# Patient Record
Sex: Female | Born: 1960
Health system: Southern US, Community
[De-identification: ages and names within clinical notes are randomized; demographics above are authoritative.]

## PROBLEM LIST (undated history)

## (undated) DIAGNOSIS — C649 Malignant neoplasm of unspecified kidney, except renal pelvis: Secondary | ICD-10-CM

## (undated) DIAGNOSIS — Z860100 Personal history of colon polyps, unspecified: Secondary | ICD-10-CM

## (undated) DIAGNOSIS — Z923 Personal history of irradiation: Secondary | ICD-10-CM

## (undated) DIAGNOSIS — C50919 Malignant neoplasm of unspecified site of unspecified female breast: Secondary | ICD-10-CM

## (undated) HISTORY — PX: NEPHRECTOMY: SHX65

---

## 2005-05-24 ENCOUNTER — Ambulatory Visit: Payer: Self-pay | Admitting: Family Medicine

## 2005-06-16 ENCOUNTER — Ambulatory Visit: Payer: Self-pay

## 2005-08-13 ENCOUNTER — Ambulatory Visit: Payer: Self-pay | Admitting: Gastroenterology

## 2005-08-13 HISTORY — PX: COLONOSCOPY W/ POLYPECTOMY: SHX1380

## 2005-09-17 ENCOUNTER — Ambulatory Visit: Payer: Self-pay | Admitting: Surgery

## 2006-07-07 ENCOUNTER — Ambulatory Visit: Payer: Self-pay

## 2006-12-02 ENCOUNTER — Ambulatory Visit: Payer: Self-pay | Admitting: Family Medicine

## 2007-07-19 ENCOUNTER — Ambulatory Visit: Payer: Self-pay

## 2007-12-05 ENCOUNTER — Ambulatory Visit: Payer: Self-pay | Admitting: Family Medicine

## 2008-02-02 DIAGNOSIS — C50919 Malignant neoplasm of unspecified site of unspecified female breast: Secondary | ICD-10-CM

## 2008-02-02 DIAGNOSIS — Z923 Personal history of irradiation: Secondary | ICD-10-CM

## 2008-02-02 HISTORY — PX: BREAST LUMPECTOMY: SHX2

## 2008-02-02 HISTORY — DX: Personal history of irradiation: Z92.3

## 2008-02-02 HISTORY — PX: BREAST BIOPSY: SHX20

## 2008-02-02 HISTORY — DX: Malignant neoplasm of unspecified site of unspecified female breast: C50.919

## 2008-04-03 ENCOUNTER — Ambulatory Visit: Payer: Self-pay | Admitting: Family Medicine

## 2008-09-03 ENCOUNTER — Ambulatory Visit: Payer: Self-pay

## 2008-09-09 ENCOUNTER — Ambulatory Visit: Payer: Self-pay

## 2008-09-20 ENCOUNTER — Ambulatory Visit: Payer: Self-pay | Admitting: Surgery

## 2008-09-27 ENCOUNTER — Ambulatory Visit: Payer: Self-pay | Admitting: Surgery

## 2008-10-02 ENCOUNTER — Ambulatory Visit: Payer: Self-pay | Admitting: Oncology

## 2008-10-14 ENCOUNTER — Ambulatory Visit: Payer: Self-pay | Admitting: Oncology

## 2008-10-25 ENCOUNTER — Encounter: Admission: RE | Admit: 2008-10-25 | Discharge: 2008-10-25 | Payer: Self-pay | Admitting: Oncology

## 2008-11-01 ENCOUNTER — Ambulatory Visit: Payer: Self-pay | Admitting: Oncology

## 2008-11-06 ENCOUNTER — Ambulatory Visit: Payer: Self-pay | Admitting: Surgery

## 2008-11-08 ENCOUNTER — Ambulatory Visit: Payer: Self-pay | Admitting: Surgery

## 2008-12-02 ENCOUNTER — Ambulatory Visit: Payer: Self-pay | Admitting: Oncology

## 2009-01-01 ENCOUNTER — Ambulatory Visit: Payer: Self-pay | Admitting: Oncology

## 2009-02-01 ENCOUNTER — Ambulatory Visit: Payer: Self-pay | Admitting: Oncology

## 2009-03-04 ENCOUNTER — Ambulatory Visit: Payer: Self-pay | Admitting: Oncology

## 2009-04-01 ENCOUNTER — Ambulatory Visit: Payer: Self-pay | Admitting: Oncology

## 2009-05-02 ENCOUNTER — Ambulatory Visit: Payer: Self-pay | Admitting: Oncology

## 2009-05-28 ENCOUNTER — Ambulatory Visit: Payer: Self-pay | Admitting: Oncology

## 2009-06-01 ENCOUNTER — Ambulatory Visit: Payer: Self-pay | Admitting: Oncology

## 2009-07-02 ENCOUNTER — Ambulatory Visit: Payer: Self-pay | Admitting: Oncology

## 2009-09-25 ENCOUNTER — Ambulatory Visit: Payer: Self-pay | Admitting: Oncology

## 2009-10-02 ENCOUNTER — Ambulatory Visit: Payer: Self-pay | Admitting: Oncology

## 2009-11-01 ENCOUNTER — Ambulatory Visit: Payer: Self-pay | Admitting: Oncology

## 2009-12-29 ENCOUNTER — Ambulatory Visit: Payer: Self-pay | Admitting: Oncology

## 2009-12-31 LAB — CANCER ANTIGEN 27.29: CA 27.29: 23.4 U/mL (ref 0.0–38.6)

## 2010-01-01 ENCOUNTER — Ambulatory Visit: Payer: Self-pay | Admitting: Oncology

## 2010-03-31 ENCOUNTER — Ambulatory Visit: Payer: Self-pay | Admitting: Oncology

## 2010-04-02 ENCOUNTER — Ambulatory Visit: Payer: Self-pay | Admitting: Oncology

## 2010-06-10 ENCOUNTER — Ambulatory Visit: Payer: Self-pay | Admitting: Oncology

## 2010-06-19 ENCOUNTER — Other Ambulatory Visit: Payer: Self-pay | Admitting: Internal Medicine

## 2010-07-03 ENCOUNTER — Ambulatory Visit: Payer: Self-pay | Admitting: Oncology

## 2010-10-03 ENCOUNTER — Ambulatory Visit: Payer: Self-pay | Admitting: Oncology

## 2010-10-15 ENCOUNTER — Ambulatory Visit: Payer: Self-pay | Admitting: Oncology

## 2010-11-02 ENCOUNTER — Ambulatory Visit: Payer: Self-pay | Admitting: Oncology

## 2010-12-17 ENCOUNTER — Other Ambulatory Visit: Payer: Self-pay | Admitting: Family Medicine

## 2010-12-18 ENCOUNTER — Ambulatory Visit: Payer: Self-pay | Admitting: Gastroenterology

## 2011-04-15 ENCOUNTER — Ambulatory Visit: Payer: Self-pay | Admitting: Oncology

## 2011-04-15 LAB — CBC CANCER CENTER
Eosinophil #: 0 x10 3/mm (ref 0.0–0.7)
Eosinophil %: 0.9 %
HCT: 37.4 % (ref 35.0–47.0)
HGB: 12.6 g/dL (ref 12.0–16.0)
MCH: 31.3 pg (ref 26.0–34.0)
MCV: 93 fL (ref 80–100)
Monocyte #: 0.4 x10 3/mm (ref 0.0–0.7)
Monocyte %: 10.1 %
Platelet: 172 x10 3/mm (ref 150–440)
RBC: 4.03 10*6/uL (ref 3.80–5.20)
WBC: 3.8 x10 3/mm (ref 3.6–11.0)

## 2011-04-15 LAB — COMPREHENSIVE METABOLIC PANEL
Albumin: 3.4 g/dL (ref 3.4–5.0)
Alkaline Phosphatase: 60 U/L (ref 50–136)
BUN: 16 mg/dL (ref 7–18)
Calcium, Total: 8.4 mg/dL — ABNORMAL LOW (ref 8.5–10.1)
SGOT(AST): 30 U/L (ref 15–37)

## 2011-05-03 ENCOUNTER — Ambulatory Visit: Payer: Self-pay | Admitting: Oncology

## 2011-06-10 ENCOUNTER — Ambulatory Visit: Payer: Self-pay | Admitting: Oncology

## 2011-07-03 ENCOUNTER — Ambulatory Visit: Payer: Self-pay | Admitting: Oncology

## 2011-10-07 ENCOUNTER — Ambulatory Visit: Payer: Self-pay | Admitting: Oncology

## 2011-10-14 ENCOUNTER — Ambulatory Visit: Payer: Self-pay | Admitting: Oncology

## 2011-10-14 LAB — CBC CANCER CENTER
Basophil %: 0.7 %
Eosinophil #: 0.1 x10 3/mm (ref 0.0–0.7)
Eosinophil %: 1.6 %
Lymphocyte #: 1.3 x10 3/mm (ref 1.0–3.6)
MCH: 32.4 pg (ref 26.0–34.0)
MCHC: 33.4 g/dL (ref 32.0–36.0)
MCV: 97 fL (ref 80–100)
Monocyte #: 0.3 x10 3/mm (ref 0.2–0.9)
Platelet: 182 x10 3/mm (ref 150–440)
RBC: 4.19 10*6/uL (ref 3.80–5.20)
WBC: 4.1 x10 3/mm (ref 3.6–11.0)

## 2011-10-14 LAB — COMPREHENSIVE METABOLIC PANEL
Bilirubin,Total: 0.5 mg/dL (ref 0.2–1.0)
Chloride: 104 mmol/L (ref 98–107)
Co2: 30 mmol/L (ref 21–32)
Creatinine: 0.94 mg/dL (ref 0.60–1.30)
Osmolality: 278 (ref 275–301)
Sodium: 139 mmol/L (ref 136–145)

## 2011-11-02 ENCOUNTER — Ambulatory Visit: Payer: Self-pay | Admitting: Oncology

## 2012-02-08 ENCOUNTER — Other Ambulatory Visit: Payer: Self-pay | Admitting: Family Medicine

## 2012-02-08 LAB — COMPREHENSIVE METABOLIC PANEL
Alkaline Phosphatase: 76 U/L (ref 50–136)
Anion Gap: 7 (ref 7–16)
BUN: 13 mg/dL (ref 7–18)
EGFR (African American): >60
Osmolality: 278 (ref 275–301)
SGOT(AST): 28 U/L (ref 15–37)

## 2012-02-08 LAB — LIPID PANEL
HDL Cholesterol: 58 mg/dL (ref 40–60)
Ldl Cholesterol, Calc: 138 mg/dL — ABNORMAL HIGH (ref 0–100)
Triglycerides: 93 mg/dL (ref 0–200)

## 2012-02-08 LAB — HEMOGLOBIN A1C: Hemoglobin A1C: 5.2 % (ref 4.2–6.3)

## 2012-04-20 ENCOUNTER — Ambulatory Visit: Payer: Self-pay | Admitting: Oncology

## 2012-04-20 LAB — CBC CANCER CENTER
Basophil #: 0 x10 3/mm (ref 0.0–0.1)
Basophil %: 0.7 %
Eosinophil %: 1.7 %
Monocyte %: 10.3 %
Neutrophil #: 1.9 x10 3/mm (ref 1.4–6.5)
Platelet: 178 x10 3/mm (ref 150–440)
RBC: 4.35 10*6/uL (ref 3.80–5.20)
RDW: 12.9 % (ref 11.5–14.5)
WBC: 4.1 x10 3/mm (ref 3.6–11.0)

## 2012-04-20 LAB — COMPREHENSIVE METABOLIC PANEL
Albumin: 3.5 g/dL (ref 3.4–5.0)
Anion Gap: 4 — ABNORMAL LOW (ref 7–16)
Bilirubin,Total: 0.5 mg/dL (ref 0.2–1.0)
Calcium, Total: 8.5 mg/dL (ref 8.5–10.1)
Creatinine: 1.06 mg/dL (ref 0.60–1.30)
EGFR (African American): >60
EGFR (Non-African Amer.): >60
Osmolality: 268 (ref 275–301)
SGOT(AST): 32 U/L (ref 15–37)
SGPT (ALT): 43 U/L (ref 12–78)
Total Protein: 7.9 g/dL (ref 6.4–8.2)

## 2012-04-21 LAB — CANCER ANTIGEN 27.29: CA 27.29: 20.2 U/mL (ref 0.0–38.6)

## 2012-05-02 ENCOUNTER — Ambulatory Visit: Payer: Self-pay | Admitting: Oncology

## 2012-06-14 ENCOUNTER — Ambulatory Visit: Payer: Self-pay | Admitting: Oncology

## 2012-10-09 ENCOUNTER — Ambulatory Visit: Payer: Self-pay | Admitting: Oncology

## 2012-10-23 ENCOUNTER — Ambulatory Visit: Payer: Self-pay | Admitting: Oncology

## 2012-10-23 LAB — COMPREHENSIVE METABOLIC PANEL
Alkaline Phosphatase: 76 U/L (ref 50–136)
Anion Gap: 9 (ref 7–16)
Bilirubin,Total: 0.4 mg/dL (ref 0.2–1.0)
Calcium, Total: 9 mg/dL (ref 8.5–10.1)
Chloride: 103 mmol/L (ref 98–107)
Creatinine: 0.92 mg/dL (ref 0.60–1.30)
EGFR (African American): >60
EGFR (Non-African Amer.): >60
Glucose: 97 mg/dL (ref 65–99)
SGOT(AST): 27 U/L (ref 15–37)
SGPT (ALT): 24 U/L (ref 12–78)
Sodium: 141 mmol/L (ref 136–145)
Total Protein: 7.6 g/dL (ref 6.4–8.2)

## 2012-10-23 LAB — CBC CANCER CENTER
Basophil #: 0 x10 3/mm (ref 0.0–0.1)
Eosinophil #: 0.1 x10 3/mm (ref 0.0–0.7)
Eosinophil %: 1.9 %
HCT: 39.5 % (ref 35.0–47.0)
HGB: 13.4 g/dL (ref 12.0–16.0)
Lymphocyte #: 1.5 x10 3/mm (ref 1.0–3.6)
Monocyte %: 11.2 %
Neutrophil #: 1.6 x10 3/mm (ref 1.4–6.5)
Neutrophil %: 44.7 %
Platelet: 192 x10 3/mm (ref 150–440)
WBC: 3.6 x10 3/mm (ref 3.6–11.0)

## 2012-11-01 ENCOUNTER — Ambulatory Visit: Payer: Self-pay | Admitting: Oncology

## 2013-01-28 ENCOUNTER — Observation Stay: Payer: Self-pay | Admitting: Surgery

## 2013-01-28 LAB — URINALYSIS, COMPLETE
Ketone: NEGATIVE
Nitrite: NEGATIVE
Ph: 6 (ref 4.5–8.0)
Specific Gravity: 1.008 (ref 1.003–1.030)

## 2013-01-28 LAB — CBC WITH DIFFERENTIAL/PLATELET
Basophil #: 0.1 10*3/uL (ref 0.0–0.1)
Basophil %: 0.5 %
HCT: 39 % (ref 35.0–47.0)
HGB: 13.2 g/dL (ref 12.0–16.0)
Lymphocyte #: 1.1 10*3/uL (ref 1.0–3.6)
MCH: 31.4 pg (ref 26.0–34.0)
MCHC: 33.9 g/dL (ref 32.0–36.0)
Monocyte #: 1.1 x10 3/mm — ABNORMAL HIGH (ref 0.2–0.9)
Monocyte %: 7.9 %
Neutrophil #: 11.1 10*3/uL — ABNORMAL HIGH (ref 1.4–6.5)
RDW: 13.2 % (ref 11.5–14.5)

## 2013-01-28 LAB — COMPREHENSIVE METABOLIC PANEL
Anion Gap: 6 — ABNORMAL LOW (ref 7–16)
Bilirubin,Total: 0.8 mg/dL (ref 0.2–1.0)
Chloride: 106 mmol/L (ref 98–107)
Osmolality: 276 (ref 275–301)
SGOT(AST): 57 U/L — ABNORMAL HIGH (ref 15–37)
SGPT (ALT): 49 U/L (ref 12–78)
Sodium: 138 mmol/L (ref 136–145)

## 2013-01-28 LAB — HCG, QUANTITATIVE, PREGNANCY: Beta Hcg, Quant.: <1 m[IU]/mL — ABNORMAL LOW

## 2013-01-30 LAB — PATHOLOGY REPORT

## 2013-02-01 DIAGNOSIS — C649 Malignant neoplasm of unspecified kidney, except renal pelvis: Secondary | ICD-10-CM

## 2013-02-01 HISTORY — PX: BREAST BIOPSY: SHX20

## 2013-02-01 HISTORY — DX: Malignant neoplasm of unspecified kidney, except renal pelvis: C64.9

## 2013-02-15 ENCOUNTER — Ambulatory Visit: Payer: Self-pay | Admitting: Surgery

## 2013-04-01 HISTORY — PX: NEPHRECTOMY: SHX65

## 2013-04-01 HISTORY — PX: TOTAL ABDOMINAL HYSTERECTOMY: SHX209

## 2013-04-10 ENCOUNTER — Ambulatory Visit: Payer: Self-pay | Admitting: Urology

## 2013-04-10 LAB — BASIC METABOLIC PANEL
ANION GAP: 2 — AB (ref 7–16)
BUN: 14 mg/dL (ref 7–18)
CHLORIDE: 106 mmol/L (ref 98–107)
CO2: 32 mmol/L (ref 21–32)
CREATININE: 0.73 mg/dL (ref 0.60–1.30)
Calcium, Total: 8.8 mg/dL (ref 8.5–10.1)
EGFR (African American): >60
GLUCOSE: 93 mg/dL (ref 65–99)
OSMOLALITY: 280 (ref 275–301)
POTASSIUM: 3.9 mmol/L (ref 3.5–5.1)
Sodium: 140 mmol/L (ref 136–145)

## 2013-04-10 LAB — HEMOGLOBIN: HGB: 12.2 g/dL (ref 12.0–16.0)

## 2013-04-10 LAB — PREGNANCY, URINE: PREGNANCY TEST, URINE: NEGATIVE m[IU]/mL

## 2013-04-16 ENCOUNTER — Inpatient Hospital Stay: Payer: Self-pay | Admitting: Urology

## 2013-04-17 LAB — BASIC METABOLIC PANEL
Anion Gap: 8 (ref 7–16)
BUN: 14 mg/dL (ref 7–18)
CALCIUM: 8.3 mg/dL — AB (ref 8.5–10.1)
CHLORIDE: 106 mmol/L (ref 98–107)
CO2: 22 mmol/L (ref 21–32)
Creatinine: 1.21 mg/dL (ref 0.60–1.30)
EGFR (African American): >60
EGFR (Non-African Amer.): 52 — ABNORMAL LOW
GLUCOSE: 89 mg/dL (ref 65–99)
Osmolality: 272 (ref 275–301)
POTASSIUM: 4.4 mmol/L (ref 3.5–5.1)
Sodium: 136 mmol/L (ref 136–145)

## 2013-04-18 LAB — CBC WITH DIFFERENTIAL/PLATELET
BASOS PCT: 0.8 %
Basophil #: 0.1 10*3/uL (ref 0.0–0.1)
EOS ABS: 0.1 10*3/uL (ref 0.0–0.7)
EOS PCT: 1.5 %
HCT: 33.3 % — ABNORMAL LOW (ref 35.0–47.0)
HGB: 11.3 g/dL — ABNORMAL LOW (ref 12.0–16.0)
Lymphocyte #: 1 10*3/uL (ref 1.0–3.6)
Lymphocyte %: 10.3 %
MCH: 31.6 pg (ref 26.0–34.0)
MCHC: 34 g/dL (ref 32.0–36.0)
MCV: 93 fL (ref 80–100)
MONO ABS: 0.7 x10 3/mm (ref 0.2–0.9)
MONOS PCT: 6.9 %
NEUTROS ABS: 7.7 10*3/uL — AB (ref 1.4–6.5)
Neutrophil %: 80.5 %
PLATELETS: 160 10*3/uL (ref 150–440)
RBC: 3.59 10*6/uL — ABNORMAL LOW (ref 3.80–5.20)
RDW: 14.1 % (ref 11.5–14.5)
WBC: 9.5 10*3/uL (ref 3.6–11.0)

## 2013-04-19 LAB — PATHOLOGY REPORT

## 2013-05-28 ENCOUNTER — Ambulatory Visit: Payer: Self-pay | Admitting: Oncology

## 2013-05-28 LAB — COMPREHENSIVE METABOLIC PANEL
ALT: 52 U/L (ref 12–78)
ANION GAP: 6 — AB (ref 7–16)
AST: 40 U/L — AB (ref 15–37)
Albumin: 3.5 g/dL (ref 3.4–5.0)
Alkaline Phosphatase: 92 U/L
BUN: 20 mg/dL — ABNORMAL HIGH (ref 7–18)
Bilirubin,Total: 0.3 mg/dL (ref 0.2–1.0)
CHLORIDE: 104 mmol/L (ref 98–107)
CO2: 31 mmol/L (ref 21–32)
CREATININE: 1.38 mg/dL — AB (ref 0.60–1.30)
Calcium, Total: 9.4 mg/dL (ref 8.5–10.1)
EGFR (African American): 52 — ABNORMAL LOW
EGFR (Non-African Amer.): 44 — ABNORMAL LOW
GLUCOSE: 94 mg/dL (ref 65–99)
Osmolality: 284 (ref 275–301)
Potassium: 4.3 mmol/L (ref 3.5–5.1)
Sodium: 141 mmol/L (ref 136–145)
Total Protein: 7.9 g/dL (ref 6.4–8.2)

## 2013-05-28 LAB — CBC CANCER CENTER
BASOS PCT: 1.2 %
Basophil #: 0 x10 3/mm (ref 0.0–0.1)
EOS PCT: 2.9 %
Eosinophil #: 0.1 x10 3/mm (ref 0.0–0.7)
HCT: 36.8 % (ref 35.0–47.0)
HGB: 12.3 g/dL (ref 12.0–16.0)
LYMPHS ABS: 1.5 x10 3/mm (ref 1.0–3.6)
Lymphocyte %: 42.2 %
MCH: 31.2 pg (ref 26.0–34.0)
MCHC: 33.3 g/dL (ref 32.0–36.0)
MCV: 94 fL (ref 80–100)
MONO ABS: 0.4 x10 3/mm (ref 0.2–0.9)
Monocyte %: 10.5 %
NEUTROS ABS: 1.5 x10 3/mm (ref 1.4–6.5)
NEUTROS PCT: 43.2 %
PLATELETS: 195 x10 3/mm (ref 150–440)
RBC: 3.94 10*6/uL (ref 3.80–5.20)
RDW: 13.5 % (ref 11.5–14.5)
WBC: 3.5 x10 3/mm — ABNORMAL LOW (ref 3.6–11.0)

## 2013-05-29 LAB — CANCER ANTIGEN 27.29: CA 27.29: 19.6 U/mL (ref 0.0–38.6)

## 2013-06-01 ENCOUNTER — Ambulatory Visit: Payer: Self-pay | Admitting: Oncology

## 2013-06-18 ENCOUNTER — Ambulatory Visit: Payer: Self-pay | Admitting: Family Medicine

## 2013-08-27 ENCOUNTER — Ambulatory Visit: Payer: Self-pay | Admitting: Oncology

## 2013-08-29 ENCOUNTER — Ambulatory Visit: Payer: Self-pay | Admitting: Oncology

## 2013-08-29 LAB — CBC CANCER CENTER
BASOS ABS: 0.1 x10 3/mm (ref 0.0–0.1)
Basophil %: 1 %
EOS ABS: 0.1 x10 3/mm (ref 0.0–0.7)
Eosinophil %: 1.5 %
HCT: 40.3 % (ref 35.0–47.0)
HGB: 13.2 g/dL (ref 12.0–16.0)
LYMPHS ABS: 2.7 x10 3/mm (ref 1.0–3.6)
Lymphocyte %: 49.3 %
MCH: 31.1 pg (ref 26.0–34.0)
MCHC: 32.7 g/dL (ref 32.0–36.0)
MCV: 95 fL (ref 80–100)
Monocyte #: 0.6 x10 3/mm (ref 0.2–0.9)
Monocyte %: 10.6 %
NEUTROS ABS: 2.1 x10 3/mm (ref 1.4–6.5)
NEUTROS PCT: 37.6 %
PLATELETS: 212 x10 3/mm (ref 150–440)
RBC: 4.24 10*6/uL (ref 3.80–5.20)
RDW: 12.9 % (ref 11.5–14.5)
WBC: 5.5 x10 3/mm (ref 3.6–11.0)

## 2013-08-29 LAB — COMPREHENSIVE METABOLIC PANEL
ALBUMIN: 3.5 g/dL (ref 3.4–5.0)
ALK PHOS: 134 U/L — AB
AST: 44 U/L — AB (ref 15–37)
Anion Gap: 7 (ref 7–16)
BILIRUBIN TOTAL: 0.4 mg/dL (ref 0.2–1.0)
BUN: 19 mg/dL — AB (ref 7–18)
CREATININE: 1.42 mg/dL — AB (ref 0.60–1.30)
Calcium, Total: 9.2 mg/dL (ref 8.5–10.1)
Chloride: 102 mmol/L (ref 98–107)
Co2: 30 mmol/L (ref 21–32)
EGFR (African American): 49 — ABNORMAL LOW
GFR CALC NON AF AMER: 43 — AB
GLUCOSE: 103 mg/dL — AB (ref 65–99)
Osmolality: 280 (ref 275–301)
Potassium: 3.7 mmol/L (ref 3.5–5.1)
SGPT (ALT): 80 U/L — ABNORMAL HIGH
Sodium: 139 mmol/L (ref 136–145)
TOTAL PROTEIN: 7.9 g/dL (ref 6.4–8.2)

## 2013-09-01 ENCOUNTER — Ambulatory Visit: Payer: Self-pay | Admitting: Oncology

## 2013-10-10 ENCOUNTER — Ambulatory Visit: Payer: Self-pay | Admitting: Oncology

## 2013-10-11 ENCOUNTER — Ambulatory Visit: Payer: Self-pay | Admitting: Oncology

## 2013-10-15 LAB — PATHOLOGY REPORT

## 2013-10-18 ENCOUNTER — Ambulatory Visit: Payer: Self-pay | Admitting: Oncology

## 2013-10-19 LAB — PATHOLOGY REPORT

## 2013-12-04 ENCOUNTER — Ambulatory Visit: Payer: Self-pay | Admitting: Surgery

## 2013-12-26 ENCOUNTER — Ambulatory Visit: Payer: Self-pay | Admitting: Surgery

## 2013-12-26 LAB — CBC WITH DIFFERENTIAL/PLATELET
Basophil #: 0 10*3/uL (ref 0.0–0.1)
Basophil %: 0.8 %
EOS ABS: 0.1 10*3/uL (ref 0.0–0.7)
EOS PCT: 2.2 %
HCT: 38 % (ref 35.0–47.0)
HGB: 12.7 g/dL (ref 12.0–16.0)
LYMPHS ABS: 1.6 10*3/uL (ref 1.0–3.6)
Lymphocyte %: 36.8 %
MCH: 32 pg (ref 26.0–34.0)
MCHC: 33.3 g/dL (ref 32.0–36.0)
MCV: 96 fL (ref 80–100)
MONOS PCT: 8.6 %
Monocyte #: 0.4 x10 3/mm (ref 0.2–0.9)
NEUTROS PCT: 51.6 %
Neutrophil #: 2.3 10*3/uL (ref 1.4–6.5)
Platelet: 200 10*3/uL (ref 150–440)
RBC: 3.96 10*6/uL (ref 3.80–5.20)
RDW: 13 % (ref 11.5–14.5)
WBC: 4.4 10*3/uL (ref 3.6–11.0)

## 2013-12-26 LAB — BASIC METABOLIC PANEL
Anion Gap: 4 — ABNORMAL LOW (ref 7–16)
BUN: 20 mg/dL — AB (ref 7–18)
CO2: 31 mmol/L (ref 21–32)
Calcium, Total: 8.9 mg/dL (ref 8.5–10.1)
Chloride: 107 mmol/L (ref 98–107)
Creatinine: 1.28 mg/dL (ref 0.60–1.30)
GFR CALC AF AMER: 57 — AB
GFR CALC NON AF AMER: 47 — AB
Glucose: 77 mg/dL (ref 65–99)
Osmolality: 285 (ref 275–301)
POTASSIUM: 4.2 mmol/L (ref 3.5–5.1)
SODIUM: 142 mmol/L (ref 136–145)

## 2014-01-04 ENCOUNTER — Inpatient Hospital Stay: Payer: Self-pay | Admitting: Surgery

## 2014-02-27 ENCOUNTER — Ambulatory Visit: Payer: Self-pay | Admitting: Oncology

## 2014-02-27 LAB — COMPREHENSIVE METABOLIC PANEL
ALBUMIN: 3.8 g/dL (ref 3.4–5.0)
ALK PHOS: 190 U/L — AB (ref 46–116)
ANION GAP: 11 (ref 7–16)
BILIRUBIN TOTAL: 0.4 mg/dL (ref 0.2–1.0)
BUN: 18 mg/dL (ref 7–18)
CALCIUM: 9 mg/dL (ref 8.5–10.1)
CREATININE: 1.23 mg/dL (ref 0.60–1.30)
Chloride: 102 mmol/L (ref 98–107)
Co2: 29 mmol/L (ref 21–32)
EGFR (African American): 59 — ABNORMAL LOW
EGFR (Non-African Amer.): 49 — ABNORMAL LOW
GLUCOSE: 90 mg/dL (ref 65–99)
OSMOLALITY: 285 (ref 275–301)
POTASSIUM: 3.8 mmol/L (ref 3.5–5.1)
SGOT(AST): 36 U/L (ref 15–37)
SGPT (ALT): 49 U/L (ref 14–63)
SODIUM: 142 mmol/L (ref 136–145)
Total Protein: 8.1 g/dL (ref 6.4–8.2)

## 2014-02-27 LAB — CBC CANCER CENTER
BASOS ABS: 0 x10 3/mm (ref 0.0–0.1)
BASOS PCT: 1 %
EOS ABS: 0.1 x10 3/mm (ref 0.0–0.7)
Eosinophil %: 3.4 %
HCT: 38.9 % (ref 35.0–47.0)
HGB: 13.1 g/dL (ref 12.0–16.0)
LYMPHS PCT: 35.1 %
Lymphocyte #: 1.4 x10 3/mm (ref 1.0–3.6)
MCH: 31.9 pg (ref 26.0–34.0)
MCHC: 33.6 g/dL (ref 32.0–36.0)
MCV: 95 fL (ref 80–100)
MONOS PCT: 9.6 %
Monocyte #: 0.4 x10 3/mm (ref 0.2–0.9)
NEUTROS PCT: 50.9 %
Neutrophil #: 2 x10 3/mm (ref 1.4–6.5)
Platelet: 219 x10 3/mm (ref 150–440)
RBC: 4.1 10*6/uL (ref 3.80–5.20)
RDW: 12.7 % (ref 11.5–14.5)
WBC: 3.9 x10 3/mm (ref 3.6–11.0)

## 2014-02-28 LAB — CANCER ANTIGEN 27.29: CA 27.29: 16.2 U/mL (ref 0.0–38.6)

## 2014-03-04 ENCOUNTER — Ambulatory Visit: Payer: Self-pay | Admitting: Oncology

## 2014-05-25 NOTE — Discharge Summary (Signed)
PATIENT NAME:  Kelly Murray, RUNKLES MR#:  [id number] DATE OF BIRTH:  May 06, 1960  DATE OF ADMISSION:  01/04/2014 DATE OF DISCHARGE:  01/06/2014  FINAL DIAGNOSIS: Complex ventral hernia.   PROCEDURE: Repair of ventral hernia with mesh.    DESCRIPTION OF HOSPITALIZATION: The patient was admitted following surgery. Her pain was well controlled with Dilaudid.   On postoperative day number 1, the patient was doing well. The drains were consistent with serosanguineous drainage and less than 100 mL total each per day.   The patient on the evening of December 5 was transitioned over to oral medications and discontinuation of her PCA Dilaudid pump, and she did well with this.   On the morning of postoperative day number 2, the patient's drains were less than 25 mL each.   DISPOSITION: She was discharged home in stable condition with the drains in place.   FOLLOW-UP: On December 10, or earlier, at Dr. Melvenia Needles discretion.   DISCHARGE MEDICATIONS: Calcium once a day, multivitamin once a day, Percocet 5/325 one tablet every 6 hours as needed.   Call with any questions or concerns.    ____________________________ Jeannette How Marina Gravel, MD mab:JT D: 01/06/2014 11:12:53 ET T: 01/06/2014 11:23:23 ET JOB#: [id number]  cc: Elta Guadeloupe A. Marina Gravel, MD, <Dictator> Hortencia Conradi MD ELECTRONICALLY SIGNED 01/06/2014 18:45

## 2014-05-25 NOTE — Discharge Summary (Signed)
PATIENT NAME:  Kelly Murray, Kelly Murray MR#:  [id number] DATE OF BIRTH:  Sep 05, 1960  DATE OF ADMISSION:  04/16/2013 DATE OF DISCHARGE:  04/19/2013  DISCHARGE DIAGNOSIS:   Fibroid tumors of the uterus and adenocarcinoma of the kidney stage pT3a.   PAST SURGICAL HISTORY:  Hysterectomy and radical right nephrectomy, date of surgery is  04/16/2013.   COMPLICATIONS: None.   HISTORY OF PRESENT ILLNESS: The patient had been found to have an adenocarcinoma  of the kidney and a large fibroid uterus after workup for an acute appendicitis in January. She elected to undergo a nephrectomy and hysterectomy at the same time and this was done on March 16.  Dr. Marcelline Mates and  Dr. Zipporah Plants did the hysterectomy and their chart work is to follow. Mine was successful and the lesions were noncancerous and fibroid in nature. The resection of the kidney was accomplished by me and Dr. Felton Clinton. We found no nodes. There was minimal blood loss and the patient was sent to recovery in satisfactory condition and then followed in the hospital for the next 2 days. Her hospital course was generally benign. Hemoglobin was only 211.  The white count was normal for postoperative and pain was minimal. She had bowel movement. She had quite a bit of flatus by the 2nd postoperative day, use almost no pain meds. Did well with her incentive spirometry. She had VTE prophylaxis with alternate compression hose. So, she was discharged in satisfactory condition. Eating and drinking well by her 3rd postoperative day. She was discharged on Percocet and Dulcolax. She took her regular home meds. On discharge, her incision was clean and dry. Staples were intact. Dressings were dry. She had no discharge.   PHYSICAL EXAMINATION: GENERAL:  She was alert and oriented x3. LUNGS: Clear to auscultation.  HEART: Sounds regular rate and rhythm with no murmur.  EXTREMITIES: No peripheral edema. No calf pain.  ABDOMEN: Soft and easily palpable despite recent surgery.    DISPOSITION: Her condition on discharge is satisfactory.   FOLLOWUP: She will be seen in followup in 10 days for a staple removal.    ____________________________ Janice Coffin. Elnoria Howard, DO rdh:dd D: 05/03/2013 13:18:58 ET T: 05/03/2013 18:00:58 ET JOB#: [id number]  cc: Janice Coffin. Elnoria Howard, DO, <Dictator> RICHARD D HART DO ELECTRONICALLY SIGNED 06/01/2013 8:11

## 2014-05-25 NOTE — Op Note (Signed)
PATIENT NAME:  Kelly Murray, Kelly Murray MR#:  [id number] DATE OF BIRTH:  August 19, 1960  DATE OF PROCEDURE:  04/16/2013  PREOPERATIVE DIAGNOSIS: Leiomyomatous uterus, pelvic pressure and right renal mass.   POSTOPERATIVE DIAGNOSIS: Leiomyomatous uterus, pelvic pressure and right renal mass.  OPERATION: Total abdominal hysterectomy with bilateral salpingo-oophorectomy.   ANESTHESIA: General.   SURGEON: Rubie Maid, MD  ASSISTANT:  Alanda Slim. DeFrancesco, MD and Barron Alvine, PA student.  ESTIMATED BLOOD LOSS:  200 mL.  OPERATIVE FLUIDS: 1100 mL.   URINE OUTPUT: 300 mL.   COMPLICATIONS: None.   FINDINGS:  On EUA included uterus that was approximately 14 to 16 weeks' size, mobile and nodular. On laparotomy there was an enlarged leiomyomatous uterus.  Normal-appearing tubes and ovaries bilaterally.  There was a small left peritubal cyst. Both ureters were able to be visualized with peristalsis noted.  On upper abdominal survey, there was a right enlarged kidney was palpable mass. The left kidney was normal and nonpalpable.  SPECIMENS:  Uterus, tubes and ovaries bilaterally and cervix.   CONDITION: Stable.   PROCEDURE: The patient was taken to the operating room where she was identified as Kelly Murray and the procedure was identified as total abdominal hysterectomy, bilateral salpingo-oophorectomy and right radical nephrectomy to be performed by urologist. A timeout was held and the above information was confirmed. After induction of general anesthesia the patient was prepped and draped in normal sterile fashion. She was placed in supine position.  After anesthesia a Foley catheter was placed. Next a supraumbilical paramedial incision was made through the subcutaneous tissue to the fascia. Fascial incision was made and extended vertically. The rectus muscle was separated off the fascial edge. The peritoneum was identified and entered. The peritoneal incision was extended longitudinally. The above  findings were noted. A Balfour retractor was then placed and the bowel was packed away from the surgical site. The round ligaments were identified and coagulated and ligated using the Harmonic scalpel. Anterior peritoneal reflection was incised and the bladder was dissected off the lower uterine segment.  The retroperitoneal space was explored and the ureters were identified bilaterally. The right infundibulopelvic ligament was then grasped and ligated using the Harmonic scalpel. After this, the left infundibulopelvic ligament was grasped and ligated using the Harmonic scalpel. Hemostasis was observed. The uterine vessels were skeletonized and clamped, cut and suture ligated with 0 Vicryl suture. Inferior pedicle of the cardinal and uterosacral ligament were clamped, cut and suture ligated with 0 Vicryl.  Due to the large size and irregular contours of the uterus secondary to fibroids, the decision was made to amputate the uterus above the endocervical os. After amputation the cervical stump was then grasped using a double-tooth tenaculum, elevated and the remaining pedicles of cardinal and uterosacral ligaments were clamped, cut and suture ligated with 0 Vicryl. Entrance was made into the vagina and the cervical stump was removed. Vaginal cuff angle sutures were placed and then the vaginal cuff was then closed with interrupted sutures of 0 Vicryl. A lavage was carried out until clear hemostasis was observed. The retractor and all packing were removed from the abdomen. The abdominal incision was then covered with a moist laparotomy sponge and the Urologist (Dr. Elnoria Howard) was then called in to perform the remaining surgery of the right radical nephrectomy.  Please see separate dictation for right radical nephrectomy procedure.  Instrument, sponge and needle counts were correct at termination of the hysterectomy procedure.    ____________________________ Chesley Noon. Marcelline Mates, MD asc:mk D: 04/16/2013 14:40:45  ET T: 04/16/2013 23:31:51 ET JOB#: [id number]  cc: Chesley Noon. Marcelline Mates, MD, <Dictator> Augusto Gamble MD ELECTRONICALLY SIGNED 04/23/2013 8:54

## 2014-05-25 NOTE — Op Note (Signed)
PATIENT NAME:  Kelly Murray, PESCADOR MR#:  [id number] DATE OF BIRTH:  07-26-1960  DATE OF PROCEDURE:  01/04/2014  ATTENDING PHYSICIAN:  Harrell Gave A. Tanieka Pownall, MD  PREOPERATIVE DIAGNOSIS:  Incisional hernia.   POSTOPERATIVE DIAGNOSIS:  Incisional hernia. Total size of combined hernia defects is 5 x 6 cm.   PROCEDURE PERFORMED:  Ventral hernia repair with 4 x 6 inch piece of Physiomesh.   ANESTHESIA:  General.   ESTIMATED BLOOD LOSS:  10 mL.   COMPLICATIONS:  None.   SPECIMENS:  None.   INDICATION FOR SURGERY:  [name] is a pleasant 54 year old female who presented with a large hernia following a hysterectomy and kidney resection. It was symptomatic. I thus agreed to repair her hernia.   DETAILS OF PROCEDURE AS FOLLOWS:  Informed consent was obtained. Ms. Durrett was brought to the operating room suite. She was induced. Endotracheal tube was placed. General anesthesia was administered. Her abdomen was prepped and draped in standard surgical fashion. A timeout was then performed correctly identifying the patient name, operative site, and procedure to be performed. A midline incision was made over the hernia. It was deepened down to the fascia. The hernia was encircled. The sac was resected. Everything was reduced. I felt underneath the resection and there were multiple hernia defects. These were all continued superior and inferiorly, total defect being 5 x 6 cm. A 10 x 15 cm piece of Physiomesh was chosen. It was sutured to the underside of the fascia using interrupted 0 Prolene sutures and U-stitches. For this, 2 large flaps were made to bring the fascia together without tension. The fascia was then closed with a looped #1 PDS ran from top to bottom and bottom to top and tied in the middle prior to stapling the skin. Two JPs were placed in the dead space created by the flaps and sutured in place with 3-0 nylon sutures. The skin was then stapled. Dressing was then placed over the wound. The patient was  then awoken, extubated, and brought to the postanesthesia care unit. There were no immediate complications. Needle, sponge, and instrument counts were correct at the end of the procedure.    ____________________________ Glena Norfolk. Sani Loiseau, MD cal:nb D: 01/07/2014 19:58:45 ET T: 01/07/2014 23:02:08 ET JOB#: [id number]  cc: Harrell Gave A. Sueann Brownley, MD, <Dictator> Floyde Parkins MD ELECTRONICALLY SIGNED 01/29/2014 16:21

## 2014-05-25 NOTE — Op Note (Signed)
PATIENT NAME:  Kelly Murray, Kelly Murray MR#:  [id number] DATE OF BIRTH:  17-Nov-1960  DATE OF PROCEDURE:  01/28/2013  PREOPERATIVE DIAGNOSIS: Right lower quadrant pain, probable appendicitis.   POSTOPERATIVE DIAGNOSES:  1.  Acute appendicitis.  2.  Umbilical hernia.  3.  Uterine fibroids.   ANESTHESIA: General.   SURGEON: Rodena Goldmann, MD   OPERATIVE PROCEDURE: With the patient in the supine position after induction of appropriate general anesthesia, the patient's abdomen was prepped with ChloraPrep and draped with sterile towels. The patient was placed in the head down, feet up position. A small transverse infraumbilical incision was made in the side of the inguinal hernia and carried down through the subcutaneous tissue with Bovie cautery. The peritoneum was immediately encountered and entered into the peritoneal cavity. An Origin balloon trocar inserted into the peritoneal cavity, balloon inserted and CO2 insufflated. A transverse incision was made and an 11 mm port inserted under direct vision. The right lower quadrant was investigated. A right tube and ovary easily visualized. There was a massive uterine fibroid on the right side, a smaller one in the midline and a smaller one on the left.  Dissection was carried out in the right lower quadrant. The appendix was not visualized in its entirety, but the base was visualized and there were inflammatory changes along the pelvic side wall suggestive of an acute appendicitis. A left lower quadrant transverse incision was made and a 12 mm port inserted under direct vision. The camera was moved to the upper port and dissection carried through the two lower ports. The base of the appendix was manipulated and the mesoappendix slowly dissected free from the surrounding tissue with care being taken to avoid any damaging the remaining structures. The mesoappendix was taken down with a single application of the Endo GIA stapling device and the base of the appendix via a  single application of the Endo GIA stapling device carrying a blue load. The appendix was captured in an Endo Catch apparatus and removed through the left lower quadrant incision. The abdomen was then copiously irrigated. No significant bleeding was encountered and did not appear to be any evidence of infection remaining. The left lower quadrant fascial incision was closed with figure-of-eight sutures of 0 Vicryl using the suture passer. The abdomen was then desufflated. All ports were withdrawn without difficulty. The umbilical skin was separated from the fascia and a vest-over-pants closure accomplished using a 0 Surgilon. The area was infiltrated with 0.25% Marcaine for postoperative pain control. Umbilical skin was reapproximated with 0 Vicryl. The skin was reapproximated with staples. Sterile dressings were applied.  The patient was returned to the recovery room after tolerating the procedure well.  Sponge and needle count was correct x 2 in the operating room.     ____________________________ Micheline Maze, MD rle:cc D: 01/28/2013 13:04:00 ET T: 01/28/2013 21:45:39 ET JOB#: [id number]  cc: Micheline Maze, MD, <Dictator> Bethena Roys. Ancil Boozer, MD Rodena Goldmann MD ELECTRONICALLY SIGNED 02/01/2013 16:19

## 2014-05-25 NOTE — H&P (Signed)
PATIENT NAME:  Kelly, Murray MR#:  [id number] DATE OF BIRTH:  23-Dec-1960  DATE OF ADMISSION:  01/28/2013  PRIMARY CARE PHYSICIAN: Dr. Ancil Boozer.   ADMITTING PHYSICIAN: Dr. Pat Patrick.   CHIEF COMPLAINT: Abdominal pain, nausea, vomiting.   BRIEF HISTORY: Kelly Murray is a 54 year old woman with a 2 to 3 day history of abdominal pain. The pain started in the periumbilical area. She thought it was related to something she ate. Over the last 24 hours the pain has migrated to the right lower quadrant. She continues to have significant right lower quadrant pain and has recently become associated with profound nausea and vomiting. She also had a small amount of diarrhea. She noted low-grade fever at home. Because of her persistent symptoms, she presented to the Emergency Room for further evaluation.   She has had one previous similar episode of right lower quadrant pain which resolved spontaneously and she felt was related to eating some seafood to which she has a profound allergy. She denies history of hepatitis, yellow jaundice, pancreatitis, peptic ulcer disease, gallbladder disease or diverticulitis. Her only past medical problems is right breast cancer for which she underwent wide excision, sentinel node biopsy and radiation therapy. She is regularly followed by the [facility] and Dr. Ancil Boozer. She has not had a previous colonoscopy or any other GI work-up. Work-up the Emergency Room revealed a slightly elevated white blood cell count of 13,000. Laboratory values were otherwise remarkable. CT scan with p.o. contrast, but without IV contrast was performed. There are multiple findings including a large uterine fibroid, some questionable fullness in the right adnexa, possibly edematous appendix and a large pelvic kidney mass. The surgical service was consulted after re-review of the CT films suggested possible acute appendicitis.   She denies any cardiac disease, hypertension, diabetes or thyroid disease. The  patient is not a cigarette smoker. Drinks alcohol occasionally.   REVIEW OF SYSTEMS: Otherwise unremarkable. She denies any urinary tract symptoms currently. She does not have any GYN symptoms. Her only other previous surgery was a c-section.   FAMILY HISTORY: Noncontributory, but positive for breast and colon cancer.   PHYSICAL EXAMINATION: GENERAL: She is an alert, reasonably comfortable woman lying at rest in bed. She does complain of right lower quadrant abdominal pain which she rates at a 5.  VITAL SIGNS: Temperature is 98.3. Blood pressure is 111/68, heart rate 84 and regular.  HEENT: No scleral icterus. No pupillary abnormalities. No facial deformities.  NECK: Supple, nontender with a midline trachea and no adenopathy.  CHEST: Clear with no adventitious sounds. She has normal pulmonary excursion.  CARDIAC: No murmurs or gallops to my ear and she seems to be in normal sinus rhythm.  ABDOMEN: Reveals some marked right lower quadrant tenderness with guarding and rebound. She has a small umbilical hernia, which is easily reducible. She has hypoactive but present bowel sounds. No other hernias or masses are noted. She does have a fullness in the right lower quadrant, which may represent the large known fibroid of the uterus.  EXTREMITIES: Full range of motion. Good distal pulses. No deformities.  PSYCHIATRIC: Normal orientation, normal affect.  I have independently reviewed her CT scan. Because of the lack of IV contrast and the huge fibroid it is very difficult to identify the right  upper quadrant structures, but I will accept the  report from the radiologist that the appendix is edematous and possibly inflamed. In the setting with acute abdominal findings, I think laparoscopy is the best option. I  talked  with her about that plan. Family members present. They are in agreement with the current option. We will proceed to the Operating Room for laparoscopy, possible laparoscopic appendectomy. At  some point, she will need prepped for contrast CT or intravenous pyelogram to look at her right kidney mass, which could represent an early neoplasm.     ____________________________ Micheline Maze, MD rle:sg D: 01/28/2013 08:53:57 ET T: 01/28/2013 11:43:09 ET JOB#: [id number]  cc: Micheline Maze, MD, <Dictator> Bethena Roys. Ancil Boozer, MD  Rodena Goldmann MD ELECTRONICALLY SIGNED 02/01/2013 16:19

## 2014-05-25 NOTE — Op Note (Signed)
PATIENT NAME:  Kelly Murray, Kelly Murray MR#:  [id number] DATE OF BIRTH:  1961-01-27  DATE OF PROCEDURE:  04/16/2013  PREOPERATIVE DIAGNOSIS: Right renal tumor.   POSTOPERATIVE DIAGNOSIS: Right renal adenocarcinoma.   PROCEDURE: Radical right nephrectomy.   SURGEON: Richard D. Elnoria Howard, DO  ASSISTANT: Mark A. Marina Gravel, MD   ANESTHESIA: General.  ESTIMATED BLOOD LOSS: 100 mL.   DESCRIPTION OF PROCEDURE: With the patient sterilely prepped and draped and following a hysterectomy and after an appropriate timeout. I extend the hysterectomy incision from the xiphoid process to the umbilicus. The small bowel is then run. The previous appendectomy site is seen, and this is healing well from 4 months ago. Then, the duodenum and the right colon are maneuvered laterally off the vena cava to just above the aorta. A small accessory right renal vein is seen along with a small accessory right renal artery in the lower pole. These are tied off with 0 silk ties. The 0 silk ties are done in a vascular fashion, two on the proximal side of the artery and vein and one on the renal side. Then, a clip is also placed for safety, and we use Metzenbaum scissors to divide the artery. Then, we found the larger main renal vein near the upper pole, and because of the configuration of the tumor, it has pushed the kidney posteriorly. However, once I dissect off the renal vein and lift the renal vein, I can find the renal artery. So then, I carefully dissect the renal artery away from its lymphatic attachments using blunt dissection and careful ultrasonic destruction of the tissue with sonic energy. This localizes the electrical energy so that we are able to carefully dissect away all tissues around the artery. Then, the artery is first approached with double ties around the artery with 0 silk. Proximally, it is tied off with two 0 silk sutures in a vascular fashion so that the artery is pinched between the sutures. Then, the renal side of the artery  is tied off, clip placed and excised the artery. This was repeated with a large renal vein. Once we have control of the vascular system, I am able to dissect all the way around the kidney. The harmonic scalpel with its sonic energy is utilized throughout, incising through the tissues around the kidney. The kidney is sent with its Gerota's fascia. It is a radical surgery. However, we do not excise the adrenal. This is left in as it is not usually involved with tumor. Once we have excised the tissues around the kidney, it is easily removed. There is no bleeding at the end of the procedure. We use irrigation in the retroperitoneum. There is nothing in the retroperitoneum that shows any sign of lymphatic enlargement or bleeding. The vena cava and the gonadal vein are intact. Before the kidney was removed, we of course double clipped the ureter and incised the ureter sharply. The whole specimen, including ureter, is sent to pathology. The bowels are then moved gently into the retroperitoneum. The liver is checked. There are no masses. Gallbladder is intact without stones. So, I close the incision from first hysterectomy incision upward. I use 0 Maxon suture for the closure of the fascia, and then staples are utilized for closure of the skin. She is sent to recovery with sterile dressings and satisfactory condition.    ____________________________ Janice Coffin. Elnoria Howard, DO rdh:lb D: 04/16/2013 16:16:05 ET T: 04/17/2013 06:52:39 ET JOB#: [id number]  cc: Janice Coffin. Elnoria Howard, DO, <Dictator> RICHARD D HART DO  ELECTRONICALLY SIGNED 04/19/2013 17:35

## 2014-07-22 ENCOUNTER — Other Ambulatory Visit: Payer: Self-pay | Admitting: Family Medicine

## 2014-07-22 DIAGNOSIS — C649 Malignant neoplasm of unspecified kidney, except renal pelvis: Secondary | ICD-10-CM

## 2014-08-23 ENCOUNTER — Other Ambulatory Visit: Payer: Self-pay | Admitting: *Deleted

## 2014-08-23 DIAGNOSIS — C50919 Malignant neoplasm of unspecified site of unspecified female breast: Secondary | ICD-10-CM

## 2014-08-29 ENCOUNTER — Encounter: Payer: Self-pay | Admitting: Oncology

## 2014-08-29 ENCOUNTER — Inpatient Hospital Stay: Payer: 59

## 2014-08-29 ENCOUNTER — Inpatient Hospital Stay: Payer: 59 | Attending: Oncology | Admitting: Oncology

## 2014-08-29 VITALS — BP 137/87 | HR 76 | Temp 96.3°F | Wt 180.6 lb

## 2014-08-29 DIAGNOSIS — Z87891 Personal history of nicotine dependence: Secondary | ICD-10-CM | POA: Diagnosis not present

## 2014-08-29 DIAGNOSIS — Z905 Acquired absence of kidney: Secondary | ICD-10-CM

## 2014-08-29 DIAGNOSIS — C50919 Malignant neoplasm of unspecified site of unspecified female breast: Secondary | ICD-10-CM

## 2014-08-29 DIAGNOSIS — Z923 Personal history of irradiation: Secondary | ICD-10-CM

## 2014-08-29 DIAGNOSIS — Z90722 Acquired absence of ovaries, bilateral: Secondary | ICD-10-CM

## 2014-08-29 DIAGNOSIS — C649 Malignant neoplasm of unspecified kidney, except renal pelvis: Secondary | ICD-10-CM | POA: Insufficient documentation

## 2014-08-29 DIAGNOSIS — Z90710 Acquired absence of both cervix and uterus: Secondary | ICD-10-CM

## 2014-08-29 DIAGNOSIS — Z806 Family history of leukemia: Secondary | ICD-10-CM | POA: Insufficient documentation

## 2014-08-29 DIAGNOSIS — Z801 Family history of malignant neoplasm of trachea, bronchus and lung: Secondary | ICD-10-CM | POA: Diagnosis not present

## 2014-08-29 DIAGNOSIS — Z85528 Personal history of other malignant neoplasm of kidney: Secondary | ICD-10-CM

## 2014-08-29 DIAGNOSIS — Z800 Family history of malignant neoplasm of digestive organs: Secondary | ICD-10-CM

## 2014-08-29 DIAGNOSIS — Z853 Personal history of malignant neoplasm of breast: Secondary | ICD-10-CM

## 2014-08-29 DIAGNOSIS — Z9223 Personal history of estrogen therapy: Secondary | ICD-10-CM

## 2014-08-29 DIAGNOSIS — Z170 Estrogen receptor positive status [ER+]: Secondary | ICD-10-CM | POA: Diagnosis not present

## 2014-08-29 DIAGNOSIS — Z803 Family history of malignant neoplasm of breast: Secondary | ICD-10-CM | POA: Diagnosis not present

## 2014-08-29 DIAGNOSIS — C642 Malignant neoplasm of left kidney, except renal pelvis: Secondary | ICD-10-CM

## 2014-08-29 LAB — CBC WITH DIFFERENTIAL/PLATELET
BASOS ABS: 0 10*3/uL (ref 0–0.1)
BASOS PCT: 1 %
EOS ABS: 0.1 10*3/uL (ref 0–0.7)
Eosinophils Relative: 3 %
HEMATOCRIT: 40.1 % (ref 35.0–47.0)
Hemoglobin: 13.3 g/dL (ref 12.0–16.0)
LYMPHS ABS: 1.4 10*3/uL (ref 1.0–3.6)
LYMPHS PCT: 35 %
MCH: 31.5 pg (ref 26.0–34.0)
MCHC: 33.2 g/dL (ref 32.0–36.0)
MCV: 94.8 fL (ref 80.0–100.0)
Monocytes Absolute: 0.4 10*3/uL (ref 0.2–0.9)
Monocytes Relative: 10 %
NEUTROS ABS: 2 10*3/uL (ref 1.4–6.5)
Neutrophils Relative %: 51 %
PLATELETS: 200 10*3/uL (ref 150–440)
RBC: 4.23 MIL/uL (ref 3.80–5.20)
RDW: 13.1 % (ref 11.5–14.5)
WBC: 4 10*3/uL (ref 3.6–11.0)

## 2014-08-29 LAB — COMPREHENSIVE METABOLIC PANEL
ALBUMIN: 4.1 g/dL (ref 3.5–5.0)
ALT: 34 U/L (ref 14–54)
ANION GAP: 6 (ref 5–15)
AST: 40 U/L (ref 15–41)
Alkaline Phosphatase: 142 U/L — ABNORMAL HIGH (ref 38–126)
BUN: 17 mg/dL (ref 6–20)
CALCIUM: 9.2 mg/dL (ref 8.9–10.3)
CO2: 28 mmol/L (ref 22–32)
CREATININE: 1.19 mg/dL — AB (ref 0.44–1.00)
Chloride: 105 mmol/L (ref 101–111)
GFR calc Af Amer: 60 mL/min — ABNORMAL LOW (ref >60)
GFR, EST NON AFRICAN AMERICAN: 52 mL/min — AB (ref >60)
Glucose, Bld: 99 mg/dL (ref 65–99)
POTASSIUM: 4 mmol/L (ref 3.5–5.1)
Sodium: 139 mmol/L (ref 135–145)
TOTAL PROTEIN: 8 g/dL (ref 6.5–8.1)
Total Bilirubin: 0.9 mg/dL (ref 0.3–1.2)

## 2014-08-29 NOTE — Progress Notes (Signed)
[place] @ [facility] Telephone:(336) [phone]  Fax:(336) [place]: January 26, 1961  MR#: [id number]  OFB#:[id number]  Patient Care Team: Steele Sizer, MD as PCP - General (Family Medicine)  CHIEF COMPLAINT:  Chief Complaint  Patient presents with  . Follow-up    Oncology History   1. Breast Ca. s/p lumpectomy, clear margins. Stage I disease.Oncotype DX score was low 2. She has finished radiation therapy. Now on tamoxifen, started in December of 2010. 3Carcinoma of kidney(March, 2015) Status post nephrectomy T3, N0, M0 tumor stage III status post hysterectomy and bilateral  salpingo-oophorectomy 3.abnormal mammogram in September of 2015 biopsies where fact necrosis and negative for malignancy Patient is off all anti-hormonal therapy 4.  Ventral hernia operated in November of 2015     Cancer of kidney   08/29/2014 Initial Diagnosis Cancer of kidney    53 year old African-American lady came in here for further follow-up regarding carcinoma of right breast and cancer of left kidney INTERVAL HISTORY:  Patient is due for mammogram. The patient does not have a hematuria and no chills fever patient is being followed by urologist.  Patient is off anti-hormonal therapy.  No bony pain.  REVIEW OF SYSTEMS:   GENERAL:  Feels good.  Active.  No fevers, sweats or weight loss. PERFORMANCE STATUS (ECOG): 0 HEENT:  No visual changes, runny nose, sore throat, mouth sores or tenderness. Lungs: No shortness of breath or cough.  No hemoptysis. Cardiac:  No chest pain, palpitations, orthopnea, or PND. GI:  No nausea, vomiting, diarrhea, constipation, melena or hematochezia. GU:  No urgency, frequency, dysuria, or hematuria. Musculoskeletal:  No back pain.  No joint pain.  No muscle tenderness. Extremities:  No pain or swelling. Skin:  No rashes or skin changes. Neuro:  No headache, numbness or weakness, balance or coordination issues. Endocrine:  No diabetes, thyroid issues,  hot flashes or night sweats. Psych:  No mood changes, depression or anxiety. Pain:  No focal pain. Review of systems:  All other systems reviewed and found to be negative. As per HPI. Otherwise, a complete review of systems is negatve.  Significant History/PMH:   anemia:    Hysterectomy:    Nephrectomy:    Breast Cancer:   Preventive Screening:  Has patient had any of the following test? Colonscopy  Mammography (1)   Last Colonoscopy: 12/18/10(1)   Last Mammography:  last mammogram was in September of 2015    Smoking History: Smoking History Never Smoked.(1)  PFSH: Additional Past Medical and Surgical History: family history of breast cancer ,colon cancer ,  leukemia, lung cancer, and lymphoma.  Family history is positive for diabetes, insulin-dependent, and hypertension   ADVANCED DIRECTIVES: Patient does not have any living will or healthcare power of attorney.  Information was given .  Available resources had been discussed.  We will follow-up on subsequent appointments regarding this issue  HEALTH MAINTENANCE: History  Substance Use Topics  . Smoking status: Former Research scientist (life sciences)  . Smokeless tobacco: Not on file  . Alcohol Use: Not on file      Allergies  Allergen Reactions  . Iodine   . Shellfish Allergy     No current outpatient prescriptions on file.   No current facility-administered medications for this visit.    OBJECTIVE:  Filed Vitals:   08/29/14 0847  BP: 137/87  Pulse: 76  Temp: 96.3 F (35.7 C)     There is no height on file to calculate BMI.    ECOG FS:0 -  Asymptomatic  PHYSICAL EXAM: GENERAL:  Well developed, well nourished, sitting comfortably in the exam room in no acute distress. MENTAL STATUS:  Alert and oriented to person, place and time. .  RESPIRATORY:  Clear to auscultation without rales, wheezes or rhonchi. CARDIOVASCULAR:  Regular rate and rhythm without murmur, rub or gallop. BREAST:  Right breast induration and scar tissue in  the right lower quadrant . skin changes or nipple discharge.  Left breast without masses, skin changes or nipple discharge. ABDOMEN:  Soft, non-tender, with active bowel sounds, and no hepatosplenomegaly.  No masses. BACK:  No CVA tenderness.  No tenderness on percussion of the back or rib cage. SKIN:  No rashes, ulcers or lesions. EXTREMITIES: No edema, no skin discoloration or tenderness.  No palpable cords. LYMPH NODES: No palpable cervical, supraclavicular, axillary or inguinal adenopathy  NEUROLOGICAL: Unremarkable. PSYCH:  Appropriate.   LAB RESULTS:  Appointment on 08/29/2014  Component Date Value Ref Range Status  . WBC 08/29/2014 4.0  3.6 - 11.0 K/uL Final  . RBC 08/29/2014 4.23  3.80 - 5.20 MIL/uL Final  . Hemoglobin 08/29/2014 13.3  12.0 - 16.0 g/dL Final  . HCT 08/29/2014 40.1  35.0 - 47.0 % Final  . MCV 08/29/2014 94.8  80.0 - 100.0 fL Final  . MCH 08/29/2014 31.5  26.0 - 34.0 pg Final  . MCHC 08/29/2014 33.2  32.0 - 36.0 g/dL Final  . RDW 08/29/2014 13.1  11.5 - 14.5 % Final  . Platelets 08/29/2014 200  150 - 440 K/uL Final  . Neutrophils Relative % 08/29/2014 51   Final  . Neutro Abs 08/29/2014 2.0  1.4 - 6.5 K/uL Final  . Lymphocytes Relative 08/29/2014 35   Final  . Lymphs Abs 08/29/2014 1.4  1.0 - 3.6 K/uL Final  . Monocytes Relative 08/29/2014 10   Final  . Monocytes Absolute 08/29/2014 0.4  0.2 - 0.9 K/uL Final  . Eosinophils Relative 08/29/2014 3   Final  . Eosinophils Absolute 08/29/2014 0.1  0 - 0.7 K/uL Final  . Basophils Relative 08/29/2014 1   Final  . Basophils Absolute 08/29/2014 0.0  0 - 0.1 K/uL Final  . Sodium 08/29/2014 139  135 - 145 mmol/L Final  . Potassium 08/29/2014 4.0  3.5 - 5.1 mmol/L Final  . Chloride 08/29/2014 105  101 - 111 mmol/L Final  . CO2 08/29/2014 28  22 - 32 mmol/L Final  . Glucose, Bld 08/29/2014 99  65 - 99 mg/dL Final  . BUN 08/29/2014 17  6 - 20 mg/dL Final  . Creatinine, Ser 08/29/2014 1.19* 0.44 - 1.00 mg/dL Final  .  Calcium 08/29/2014 9.2  8.9 - 10.3 mg/dL Final  . Total Protein 08/29/2014 8.0  6.5 - 8.1 g/dL Final  . Albumin 08/29/2014 4.1  3.5 - 5.0 g/dL Final  . AST 08/29/2014 40  15 - 41 U/L Final  . ALT 08/29/2014 34  14 - 54 U/L Final  . Alkaline Phosphatase 08/29/2014 142* 38 - 126 U/L Final  . Total Bilirubin 08/29/2014 0.9  0.3 - 1.2 mg/dL Final  . GFR calc non Af Amer 08/29/2014 52* >60 mL/min Final  . GFR calc Af Amer 08/29/2014 60* >60 mL/min Final   Comment: (NOTE) The eGFR has been calculated using the CKD EPI equation. This calculation has not been validated in all clinical situations. eGFR's persistently <60 mL/min signify possible Chronic Kidney Disease.   . Anion gap 08/29/2014 6  5 - 15 Final  ASSESSMENT: 1.  Carcinoma of right lower quadrant breast. No evidence of recurrent disease Another mammogram would be done. Patient is off all anti-hormonal therapy 2.  Carcinoma of kidney status post nephrectomy Evidence of recurrent disease  MEDICAL DECISION MAKING:  All lab data has been reviewed . surveillance for kidney cancer recommended as follows  For stage II and 3 after nephrectomy. Follow-up guidelines as per NCCN History and physical every 3-6 months for 3 years and annually for 5 years. Comprehensive metabolic panel and other tests as indicated every 6 months for 2 years and then annually for 5 years Abdominal imaging Baseline abdominal CT scan and MRI scan within 3--6 month then CT MRI or ultrasound every 3--6 month for at least 3 years and then annually for 5 years Chest imaging: Baseline chest CT scan within 3-6 month after radical nephrectomy.  Continuing him aging CT or chest x-ray every 6 months for 3 years. pelvic  imaging as needed. Bone scan as clinically indicated and CT or MRI of spine if clinically indicated   Patient expressed understanding and was in agreement with this plan. She also understands that She can call clinic at any time with any  questions, concerns, or complaints.    No matching staging information was found for the patient.  Forest Gleason, MD   08/29/2014 9:31 AM

## 2014-08-29 NOTE — Progress Notes (Signed)
Patient does not have living will.  Materials given.  Former smoker.

## 2014-10-15 ENCOUNTER — Ambulatory Visit
Admission: RE | Admit: 2014-10-15 | Discharge: 2014-10-15 | Disposition: A | Discharge status: Home / Self Care | Payer: 59 | Source: Ambulatory Visit | Attending: Oncology | Admitting: Oncology

## 2014-10-15 ENCOUNTER — Other Ambulatory Visit: Payer: Self-pay | Admitting: Oncology

## 2014-10-15 DIAGNOSIS — Z853 Personal history of malignant neoplasm of breast: Secondary | ICD-10-CM | POA: Diagnosis not present

## 2014-10-15 DIAGNOSIS — C50919 Malignant neoplasm of unspecified site of unspecified female breast: Secondary | ICD-10-CM

## 2014-10-15 HISTORY — DX: Malignant neoplasm of unspecified kidney, except renal pelvis: C64.9

## 2014-10-15 HISTORY — DX: Malignant neoplasm of unspecified site of unspecified female breast: C50.919

## 2015-02-07 DIAGNOSIS — Z86010 Personal history of colonic polyps: Secondary | ICD-10-CM | POA: Diagnosis not present

## 2015-02-07 DIAGNOSIS — C50919 Malignant neoplasm of unspecified site of unspecified female breast: Secondary | ICD-10-CM | POA: Insufficient documentation

## 2015-03-21 ENCOUNTER — Encounter: Payer: Self-pay | Admitting: *Deleted

## 2015-03-24 ENCOUNTER — Ambulatory Visit: Payer: 59 | Admitting: Anesthesiology

## 2015-03-24 ENCOUNTER — Encounter
Admission: RE | Disposition: A | Discharge status: Home / Self Care | Payer: Self-pay | Source: Ambulatory Visit | Attending: Gastroenterology

## 2015-03-24 ENCOUNTER — Ambulatory Visit
Admission: RE | Admit: 2015-03-24 | Discharge: 2015-03-24 | Disposition: A | Discharge status: Home / Self Care | Payer: 59 | Source: Ambulatory Visit | Attending: Gastroenterology | Admitting: Gastroenterology

## 2015-03-24 ENCOUNTER — Encounter: Payer: Self-pay | Admitting: *Deleted

## 2015-03-24 DIAGNOSIS — K648 Other hemorrhoids: Secondary | ICD-10-CM | POA: Diagnosis not present

## 2015-03-24 DIAGNOSIS — K640 First degree hemorrhoids: Secondary | ICD-10-CM | POA: Insufficient documentation

## 2015-03-24 DIAGNOSIS — Z79899 Other long term (current) drug therapy: Secondary | ICD-10-CM | POA: Diagnosis not present

## 2015-03-24 DIAGNOSIS — K635 Polyp of colon: Secondary | ICD-10-CM | POA: Diagnosis not present

## 2015-03-24 DIAGNOSIS — Z888 Allergy status to other drugs, medicaments and biological substances status: Secondary | ICD-10-CM | POA: Diagnosis not present

## 2015-03-24 DIAGNOSIS — Z800 Family history of malignant neoplasm of digestive organs: Secondary | ICD-10-CM | POA: Diagnosis not present

## 2015-03-24 DIAGNOSIS — K5730 Diverticulosis of large intestine without perforation or abscess without bleeding: Secondary | ICD-10-CM | POA: Insufficient documentation

## 2015-03-24 DIAGNOSIS — Z1211 Encounter for screening for malignant neoplasm of colon: Secondary | ICD-10-CM | POA: Diagnosis not present

## 2015-03-24 DIAGNOSIS — Z85528 Personal history of other malignant neoplasm of kidney: Secondary | ICD-10-CM | POA: Insufficient documentation

## 2015-03-24 DIAGNOSIS — D124 Benign neoplasm of descending colon: Secondary | ICD-10-CM | POA: Diagnosis not present

## 2015-03-24 DIAGNOSIS — K649 Unspecified hemorrhoids: Secondary | ICD-10-CM | POA: Diagnosis not present

## 2015-03-24 DIAGNOSIS — Z8371 Family history of colonic polyps: Secondary | ICD-10-CM | POA: Insufficient documentation

## 2015-03-24 DIAGNOSIS — Z86010 Personal history of colonic polyps: Secondary | ICD-10-CM | POA: Diagnosis not present

## 2015-03-24 DIAGNOSIS — Z91013 Allergy to seafood: Secondary | ICD-10-CM | POA: Insufficient documentation

## 2015-03-24 DIAGNOSIS — K5790 Diverticulosis of intestine, part unspecified, without perforation or abscess without bleeding: Secondary | ICD-10-CM | POA: Diagnosis not present

## 2015-03-24 HISTORY — DX: Personal history of colonic polyps: Z86.010

## 2015-03-24 HISTORY — DX: Personal history of colon polyps, unspecified: Z86.0100

## 2015-03-24 HISTORY — PX: COLONOSCOPY WITH PROPOFOL: SHX5780

## 2015-03-24 SURGERY — COLONOSCOPY WITH PROPOFOL
Anesthesia: General

## 2015-03-24 MED ORDER — SODIUM CHLORIDE 0.9 % IV SOLN
INTRAVENOUS | Status: DC
  Administered 2015-03-24 (×2): via INTRAVENOUS

## 2015-03-24 MED ORDER — SODIUM CHLORIDE 0.9 % IV SOLN: INTRAVENOUS | Status: DC

## 2015-03-24 MED ORDER — PROPOFOL 10 MG/ML IV BOLUS
INTRAVENOUS | Status: DC | PRN
  Administered 2015-03-24: 90 mg via INTRAVENOUS

## 2015-03-24 MED ORDER — PROPOFOL 500 MG/50ML IV EMUL
INTRAVENOUS | Status: DC | PRN
  Administered 2015-03-24: 120 ug/kg/min via INTRAVENOUS

## 2015-03-24 NOTE — Op Note (Signed)
[facility] Gastroenterology Patient Name: Kelly Murray Procedure Date: 03/24/2015 7:35 AM MRN: [id number] Account #: [id number] Date of Birth: October 24, 1960 Admit Type: Outpatient Age: 55 Room: [facility] ENDO ROOM 3 Gender: Female Note Status: Finalized Procedure:            Colonoscopy Indications:          Family history of colon cancer in multiple                        second-degree relatives, Family history of colonic                        polyps in a first-degree relative, Personal history of                        colonic polyps Providers:            Lollie Sails, MD Referring MD:         Bethena Roys. Sowles, MD (Referring MD) Medicines:            Monitored Anesthesia Care Complications:        No immediate complications. Procedure:            Pre-Anesthesia Assessment:                       - ASA Grade Assessment: III - A patient with severe                        systemic disease.                       After obtaining informed consent, the colonoscope was                        passed under direct vision. Throughout the procedure,                        the patient's blood pressure, pulse, and oxygen                        saturations were monitored continuously. The                        Colonoscope was introduced through the anus and                        advanced to the the cecum, identified by appendiceal                        orifice and ileocecal valve. The colonoscopy was                        performed with moderate difficulty due to significant                        looping and a tortuous colon. Successful completion of                        the procedure was aided by changing the patient to a  prone position and using manual pressure. The patient                        tolerated the procedure well. The quality of the bowel                        preparation was good. Findings:      A 2 mm polyp was found in the  descending colon. The polyp was sessile.       The polyp was removed with a cold biopsy forceps. Resection and       retrieval were complete.      Multiple small-mouthed diverticula were found in the sigmoid colon.      The retroflexed view of the distal rectum and anal verge was normal and       showed no anal or rectal abnormalities.      Non-bleeding internal hemorrhoids were found during anoscopy. The       hemorrhoids were Grade I (internal hemorrhoids that do not prolapse).      The digital rectal exam was normal. Small external skin tags. Impression:           - One 2 mm polyp in the descending colon, removed with                        a cold biopsy forceps. Resected and retrieved.                       - Diverticulosis in the sigmoid colon.                       - The distal rectum and anal verge are normal on                        retroflexion view.                       - Non-bleeding internal hemorrhoids. Recommendation:       - Discharge patient to home. Procedure Code(s):    --- Professional ---                       [phone], Colonoscopy, flexible; with biopsy, single or                        multiple Diagnosis Code(s):    --- Professional ---                       D12.4, Benign neoplasm of descending colon                       K64.0, First degree hemorrhoids                       Z80.0, Family history of malignant neoplasm of                        digestive organs                       Z83.71, Family history of colonic polyps  Z86.010, Personal history of colonic polyps                       K57.30, Diverticulosis of large intestine without                        perforation or abscess without bleeding CPT copyright 2016 American Medical Association. All rights reserved. The codes documented in this report are preliminary and upon coder review may  be revised to meet current compliance requirements. Lollie Sails, MD 03/24/2015 8:08:28 AM This  report has been signed electronically. Number of Addenda: 0 Note Initiated On: 03/24/2015 7:35 AM Scope Withdrawal Time: 0 hours 8 minutes 13 seconds  Total Procedure Duration: 0 hours 22 minutes 55 seconds       [facility]

## 2015-03-24 NOTE — H&P (Signed)
Outpatient short stay form Pre-procedure 03/24/2015 7:30 AM Lollie Sails MD  Primary Physician: Dr. Derinda Late  Reason for visit:  Colonoscopy  History of present illness:  Patient is a 55 year old female presenting today for a colonoscopy. She has a family history with multiple secondary relatives having colon cancer in primary relatives having colon polyps. She tolerated her prep well. She takes no blood thinning medications or aspirin products.    Current facility-administered medications:  .  0.9 %  sodium chloride infusion, , Intravenous, Continuous, Lollie Sails, MD, Last Rate: 20 mL/hr at 03/24/15 [phone] .  0.9 %  sodium chloride infusion, , Intravenous, Continuous, Lollie Sails, MD  Prescriptions prior to admission  Medication Sig Dispense Refill Last Dose  . calcium carbonate (TUMS EX) 750 MG chewable tablet Chew 1 tablet by mouth as needed for heartburn.   Past Week at Unknown time  . cetirizine (ZYRTEC) 10 MG chewable tablet Chew 10 mg by mouth daily.   Past Week at Unknown time  . cholecalciferol (VITAMIN D) 400 units TABS tablet Take 1,000 Units by mouth daily.   Past Week at Unknown time     Allergies  Allergen Reactions  . Iodine   . Shellfish Allergy      Past Medical History  Diagnosis Date  . Breast cancer ([place]) 2010    Right  . Cancer of kidney ([place]) 2015    Right  . History of colon polyps     Review of systems:      Physical Exam    Heart and lungs: Regular rate and rhythm without rub or gallop, lungs are bilaterally clear.    HEENT: Normocephalic atraumatic eyes are anicteric    Other:     Pertinant exam for procedure: Soft nontender nondistended bowel sounds positive normoactive.    Planned proceedures: Colonoscopy and indicated procedures. I have discussed the risks benefits and complications of procedures to include not limited to bleeding, infection, perforation and the risk of sedation and the patient wishes to  proceed.    Lollie Sails, MD Gastroenterology 03/24/2015  7:30 AM

## 2015-03-24 NOTE — Anesthesia Preprocedure Evaluation (Signed)
Anesthesia Evaluation  Patient identified by MRN, date of birth, ID band Patient awake    Reviewed: Allergy & Precautions, H&P , NPO status , Patient's Chart, lab work & pertinent test results, reviewed documented beta blocker date and time   Airway Mallampati: I  TM Distance: >3 FB Neck ROM: full    Dental no notable dental hx. (+) Caps, Chipped, Teeth Intact   Pulmonary neg pulmonary ROS,    Pulmonary exam normal breath sounds clear to auscultation       Cardiovascular Exercise Tolerance: Good negative cardio ROS Normal cardiovascular exam Rhythm:regular Rate:Normal     Neuro/Psych negative neurological ROS  negative psych ROS   GI/Hepatic negative GI ROS, Neg liver ROS,   Endo/Other  negative endocrine ROS  Renal/GU Renal disease (cancer s/p excision)  negative genitourinary   Musculoskeletal   Abdominal   Peds  Hematology negative hematology ROS (+)   Anesthesia Other Findings Past Medical History:   Breast cancer ([place])                             2010           Comment:Right   Cancer of kidney ([place])                          2015           Comment:Right   History of colon polyps                                      Reproductive/Obstetrics negative OB ROS                             Anesthesia Physical Anesthesia Plan  ASA: II  Anesthesia Plan: General   Post-op Pain Management:    Induction:   Airway Management Planned:   Additional Equipment:   Intra-op Plan:   Post-operative Plan:   Informed Consent: I have reviewed the patients History and Physical, chart, labs and discussed the procedure including the risks, benefits and alternatives for the proposed anesthesia with the patient or authorized representative who has indicated his/her understanding and acceptance.   Dental Advisory Given  Plan Discussed with: Anesthesiologist, CRNA and Surgeon  Anesthesia Plan  Comments:         Anesthesia Quick Evaluation

## 2015-03-24 NOTE — Anesthesia Postprocedure Evaluation (Signed)
Anesthesia Post Note  Patient: Kelly Murray  Procedure(s) Performed: Procedure(s) (LRB): COLONOSCOPY WITH PROPOFOL (N/A)  Patient location during evaluation: PACU Anesthesia Type: General Level of consciousness: awake and alert Pain management: pain level controlled Vital Signs Assessment: post-procedure vital signs reviewed and stable Respiratory status: spontaneous breathing, nonlabored ventilation, respiratory function stable and patient connected to nasal cannula oxygen Cardiovascular status: blood pressure returned to baseline and stable Postop Assessment: no signs of nausea or vomiting Anesthetic complications: no    Last Vitals:  Filed Vitals:   03/24/15 0830 03/24/15 0840  BP: 146/88 149/94  Pulse: 66 66  Temp:    Resp: 20 20    Last Pain: There were no vitals filed for this visit.               Martha Clan

## 2015-03-24 NOTE — Transfer of Care (Signed)
Immediate Anesthesia Transfer of Care Note  Patient: Kelly Murray  Procedure(s) Performed: Procedure(s): COLONOSCOPY WITH PROPOFOL (N/A)  Patient Location: PACU  Anesthesia Type:General  Level of Consciousness: awake  Airway & Oxygen Therapy: Patient Spontanous Breathing  Post-op Assessment: Report given to RN  Post vital signs: Reviewed and stable  Last Vitals:  Filed Vitals:   03/24/15 0656  BP: 106/52  Pulse: 69  Temp: 36.4 C  Resp: 14    Complications: No apparent anesthesia complications

## 2015-03-25 ENCOUNTER — Encounter: Payer: Self-pay | Admitting: Gastroenterology

## 2015-03-25 LAB — HM COLONOSCOPY

## 2015-03-25 LAB — SURGICAL PATHOLOGY

## 2015-04-18 ENCOUNTER — Encounter: Payer: Self-pay | Admitting: Family Medicine

## 2015-09-01 ENCOUNTER — Ambulatory Visit: Payer: 59 | Admitting: Internal Medicine

## 2015-09-01 ENCOUNTER — Other Ambulatory Visit: Payer: 59

## 2015-09-26 ENCOUNTER — Other Ambulatory Visit: Payer: Self-pay | Admitting: *Deleted

## 2015-09-26 DIAGNOSIS — C50919 Malignant neoplasm of unspecified site of unspecified female breast: Secondary | ICD-10-CM

## 2015-09-28 DIAGNOSIS — C50411 Malignant neoplasm of upper-outer quadrant of right female breast: Secondary | ICD-10-CM | POA: Insufficient documentation

## 2015-09-28 NOTE — Progress Notes (Signed)
[place]  Telephone:(336) [phone] Fax:(336) [phone]  ID: Kelly Murray OB: February 07, 1960  MR#: [id number]  [id number]  Patient Care Team: Steele Sizer, MD as PCP - General (Family Medicine)  CHIEF COMPLAINT: Pathologic stage IA ER PR positive adenocarcinoma of the upper outer quadrant right breast, stage III left kidney cancer status post nephrectomy.  INTERVAL HISTORY: Patient returns to clinic today for routine 6 month evaluation. She continues to have intermittent breast discomfort at the site of her surgery, but this is chronic and unchanged. She otherwise feels well and is asymptomatic. She has no neurologic complaints. She denies any recent fevers or illnesses. She has a good appetite and denies weight loss. She has no chest pain or shortness of breath. She denies any nausea, vomiting, constipation, or diarrhea. She has no urinary complaints. Patient feels at her baseline and offers no specific complaints today.  REVIEW OF SYSTEMS:   Review of Systems  Constitutional: Negative.  Negative for fever, malaise/fatigue and weight loss.  Respiratory: Negative.  Negative for cough and shortness of breath.   Cardiovascular: Negative.  Negative for chest pain and leg swelling.  Gastrointestinal: Negative.  Negative for abdominal pain.  Genitourinary: Negative.   Musculoskeletal: Negative.   Neurological: Negative.  Negative for weakness.  Psychiatric/Behavioral: Negative.  The patient is not nervous/anxious.     As per HPI. Otherwise, a complete review of systems is negatve.  PAST MEDICAL HISTORY: Past Medical History:  Diagnosis Date  . Breast cancer ([place]) 2010   Right  . Cancer of kidney ([place]) 2015   Right  . History of colon polyps     PAST SURGICAL HISTORY: Past Surgical History:  Procedure Laterality Date  . BREAST BIOPSY Right 2010   + radiation  . BREAST BIOPSY Right 2015   2 areas- stereo, neg  . COLONOSCOPY W/ POLYPECTOMY  08/13/2005  .  COLONOSCOPY WITH PROPOFOL N/A 03/24/2015   Procedure: COLONOSCOPY WITH PROPOFOL;  Surgeon: Lollie Sails, MD;  [facility] ENDOSCOPY;  Service: Endoscopy;  Laterality: N/A;  . NEPHRECTOMY Right     FAMILY HISTORY: Family History  Problem Relation Age of Onset  . Breast cancer Paternal Aunt 40    another 62       ADVANCED DIRECTIVES (Y/N):  N   HEALTH MAINTENANCE: Social History  Substance Use Topics  . Smoking status: Never Smoker  . Smokeless tobacco: Never Used  . Alcohol use No     Colonoscopy:  PAP:  Bone density:  Lipid panel:  Allergies  Allergen Reactions  . Iodine   . Shellfish Allergy     Current Outpatient Prescriptions  Medication Sig Dispense Refill  . calcium carbonate (TUMS EX) 750 MG chewable tablet Chew 1 tablet by mouth as needed for heartburn.    . cetirizine (ZYRTEC) 10 MG chewable tablet Chew 10 mg by mouth daily.    . cholecalciferol (VITAMIN D) 400 units TABS tablet Take 1,000 Units by mouth daily.     No current facility-administered medications for this visit.     OBJECTIVE: There were no vitals filed for this visit.   There is no height or weight on file to calculate BMI.    ECOG FS:0 - Asymptomatic  General: Well-developed, well-nourished, no acute distress. Eyes: Pink conjunctiva, anicteric sclera. Breasts: Patient declined breast exam today. Lungs: Clear to auscultation bilaterally. Heart: Regular rate and rhythm. No rubs, murmurs, or gallops. Abdomen: Soft, nontender, nondistended. No organomegaly noted, normoactive bowel sounds. Musculoskeletal: No edema, cyanosis, or  clubbing. Neuro: Alert, answering all questions appropriately. Cranial nerves grossly intact. Skin: No rashes or petechiae noted. Psych: Normal affect. Lymphatics: No cervical, calvicular, axillary or inguinal LAD.   LAB RESULTS:  Lab Results  Component Value Date   NA 139 08/29/2014   K 4.0 08/29/2014   CL 105 08/29/2014   CO2 28 08/29/2014    GLUCOSE 99 08/29/2014   BUN 17 08/29/2014   CREATININE 1.19 (H) 08/29/2014   CALCIUM 9.2 08/29/2014   PROT 8.0 08/29/2014   ALBUMIN 4.1 08/29/2014   AST 40 08/29/2014   ALT 34 08/29/2014   ALKPHOS 142 (H) 08/29/2014   BILITOT 0.9 08/29/2014   GFRNONAA 52 (L) 08/29/2014   GFRAA 60 (L) 08/29/2014    Lab Results  Component Value Date   WBC 4.0 08/29/2014   NEUTROABS 2.0 08/29/2014   HGB 13.3 08/29/2014   HCT 40.1 08/29/2014   MCV 94.8 08/29/2014   PLT 200 08/29/2014     STUDIES: No results found.  ASSESSMENT: Pathologic stage IA ER PR positive adenocarcinoma of the upper outer quadrant right breast, stage III left kidney cancer status post nephrectomy.  PLAN:    1.  Pathologic stage IA ER PR positive adenocarcinoma of the upper outer quadrant right breast: Patient completed 5 years of tamoxifen in approximately December 2015. Her most recent mammogram in September 2016 was reported as BI-RADS 2, repeat in the next 1-2 weeks. 2. Stage III left kidney cancer: Patient is status post nephrectomy in March 2015. We will get restaging CT scan in the next 1-2 months. Will continue with annual CT scans for approximately 5 years after her nephrectomy. 3. Disposition: Return to clinic in 6 months for routine evaluation.   Patient expressed understanding and was in agreement with this plan. She also understands that She can call clinic at any time with any questions, concerns, or complaints.   No matching staging information was found for the patient.  Lloyd Huger, MD   09/28/2015 11:24 PM

## 2015-09-29 ENCOUNTER — Inpatient Hospital Stay: Payer: No Typology Code available for payment source

## 2015-09-29 ENCOUNTER — Other Ambulatory Visit: Payer: 59

## 2015-09-29 ENCOUNTER — Encounter (INDEPENDENT_AMBULATORY_CARE_PROVIDER_SITE_OTHER): Payer: Self-pay

## 2015-09-29 ENCOUNTER — Inpatient Hospital Stay: Payer: No Typology Code available for payment source | Attending: Oncology | Admitting: Oncology

## 2015-09-29 ENCOUNTER — Encounter: Payer: Self-pay | Admitting: Oncology

## 2015-09-29 ENCOUNTER — Ambulatory Visit: Payer: 59

## 2015-09-29 VITALS — BP 139/84 | HR 111 | Temp 95.2°F | Resp 17 | Ht 67.0 in | Wt 173.1 lb

## 2015-09-29 DIAGNOSIS — Z853 Personal history of malignant neoplasm of breast: Secondary | ICD-10-CM | POA: Diagnosis not present

## 2015-09-29 DIAGNOSIS — Z800 Family history of malignant neoplasm of digestive organs: Secondary | ICD-10-CM

## 2015-09-29 DIAGNOSIS — Z85528 Personal history of other malignant neoplasm of kidney: Secondary | ICD-10-CM | POA: Insufficient documentation

## 2015-09-29 DIAGNOSIS — Z87891 Personal history of nicotine dependence: Secondary | ICD-10-CM | POA: Insufficient documentation

## 2015-09-29 DIAGNOSIS — G8928 Other chronic postprocedural pain: Secondary | ICD-10-CM | POA: Insufficient documentation

## 2015-09-29 DIAGNOSIS — Z905 Acquired absence of kidney: Secondary | ICD-10-CM

## 2015-09-29 DIAGNOSIS — Z90710 Acquired absence of both cervix and uterus: Secondary | ICD-10-CM | POA: Diagnosis not present

## 2015-09-29 DIAGNOSIS — Z803 Family history of malignant neoplasm of breast: Secondary | ICD-10-CM | POA: Diagnosis not present

## 2015-09-29 DIAGNOSIS — C642 Malignant neoplasm of left kidney, except renal pelvis: Secondary | ICD-10-CM

## 2015-09-29 DIAGNOSIS — Z923 Personal history of irradiation: Secondary | ICD-10-CM | POA: Diagnosis not present

## 2015-09-29 DIAGNOSIS — Z79899 Other long term (current) drug therapy: Secondary | ICD-10-CM | POA: Diagnosis not present

## 2015-09-29 DIAGNOSIS — C50411 Malignant neoplasm of upper-outer quadrant of right female breast: Secondary | ICD-10-CM

## 2015-09-29 DIAGNOSIS — Z170 Estrogen receptor positive status [ER+]: Secondary | ICD-10-CM | POA: Diagnosis not present

## 2015-09-29 DIAGNOSIS — Z801 Family history of malignant neoplasm of trachea, bronchus and lung: Secondary | ICD-10-CM

## 2015-09-29 DIAGNOSIS — Z90722 Acquired absence of ovaries, bilateral: Secondary | ICD-10-CM | POA: Insufficient documentation

## 2015-09-29 DIAGNOSIS — Z9223 Personal history of estrogen therapy: Secondary | ICD-10-CM | POA: Diagnosis not present

## 2015-09-29 DIAGNOSIS — C50919 Malignant neoplasm of unspecified site of unspecified female breast: Secondary | ICD-10-CM

## 2015-09-29 LAB — COMPREHENSIVE METABOLIC PANEL
ALT: 37 U/L (ref 14–54)
ANION GAP: 7 (ref 5–15)
AST: 36 U/L (ref 15–41)
Albumin: 4.1 g/dL (ref 3.5–5.0)
Alkaline Phosphatase: 157 U/L — ABNORMAL HIGH (ref 38–126)
BILIRUBIN TOTAL: 0.6 mg/dL (ref 0.3–1.2)
BUN: 17 mg/dL (ref 6–20)
CHLORIDE: 103 mmol/L (ref 101–111)
CO2: 27 mmol/L (ref 22–32)
Calcium: 9.1 mg/dL (ref 8.9–10.3)
Creatinine, Ser: 1.14 mg/dL — ABNORMAL HIGH (ref 0.44–1.00)
GFR, EST NON AFRICAN AMERICAN: 54 mL/min — AB (ref >60)
Glucose, Bld: 102 mg/dL — ABNORMAL HIGH (ref 65–99)
POTASSIUM: 3.3 mmol/L — AB (ref 3.5–5.1)
Sodium: 137 mmol/L (ref 135–145)
TOTAL PROTEIN: 8.1 g/dL (ref 6.5–8.1)

## 2015-09-29 LAB — CBC WITH DIFFERENTIAL/PLATELET
BASOS ABS: 0 10*3/uL (ref 0–0.1)
Basophils Relative: 1 %
EOS PCT: 2 %
Eosinophils Absolute: 0.1 10*3/uL (ref 0–0.7)
HEMATOCRIT: 39.4 % (ref 35.0–47.0)
Hemoglobin: 13.6 g/dL (ref 12.0–16.0)
LYMPHS PCT: 40 %
Lymphs Abs: 1.5 10*3/uL (ref 1.0–3.6)
MCH: 32.1 pg (ref 26.0–34.0)
MCHC: 34.6 g/dL (ref 32.0–36.0)
MCV: 92.7 fL (ref 80.0–100.0)
MONO ABS: 0.3 10*3/uL (ref 0.2–0.9)
MONOS PCT: 10 %
NEUTROS ABS: 1.7 10*3/uL (ref 1.4–6.5)
Neutrophils Relative %: 47 %
PLATELETS: 196 10*3/uL (ref 150–440)
RBC: 4.25 MIL/uL (ref 3.80–5.20)
RDW: 13.1 % (ref 11.5–14.5)
WBC: 3.6 10*3/uL (ref 3.6–11.0)

## 2015-09-29 NOTE — Progress Notes (Signed)
Last Mammo Sept 2016.  Only complaint is a shooting pain under right breast can have catching sensation.

## 2015-09-30 LAB — CANCER ANTIGEN 27.29: CA 27.29: 15.3 U/mL (ref 0.0–38.6)

## 2015-10-16 ENCOUNTER — Ambulatory Visit
Admission: RE | Admit: 2015-10-16 | Discharge: 2015-10-16 | Disposition: A | Discharge status: Home / Self Care | Payer: PRIVATE HEALTH INSURANCE | Source: Ambulatory Visit | Attending: Oncology | Admitting: Oncology

## 2015-10-16 ENCOUNTER — Other Ambulatory Visit: Payer: Self-pay | Admitting: Oncology

## 2015-10-16 DIAGNOSIS — C642 Malignant neoplasm of left kidney, except renal pelvis: Secondary | ICD-10-CM | POA: Insufficient documentation

## 2015-10-16 DIAGNOSIS — C50411 Malignant neoplasm of upper-outer quadrant of right female breast: Secondary | ICD-10-CM

## 2015-10-16 DIAGNOSIS — Z853 Personal history of malignant neoplasm of breast: Secondary | ICD-10-CM | POA: Insufficient documentation

## 2015-10-16 HISTORY — DX: Personal history of irradiation: Z92.3

## 2015-12-09 ENCOUNTER — Ambulatory Visit
Admission: RE | Admit: 2015-12-09 | Discharge: 2015-12-09 | Disposition: A | Discharge status: Home / Self Care | Payer: Self-pay | Source: Ambulatory Visit | Attending: Oncology | Admitting: Oncology

## 2015-12-09 DIAGNOSIS — Z905 Acquired absence of kidney: Secondary | ICD-10-CM | POA: Insufficient documentation

## 2015-12-09 DIAGNOSIS — Z90710 Acquired absence of both cervix and uterus: Secondary | ICD-10-CM | POA: Insufficient documentation

## 2015-12-09 DIAGNOSIS — C642 Malignant neoplasm of left kidney, except renal pelvis: Secondary | ICD-10-CM | POA: Insufficient documentation

## 2015-12-09 LAB — POCT I-STAT CREATININE: Creatinine, Ser: 1 mg/dL (ref 0.44–1.00)

## 2015-12-09 MED ORDER — IOPAMIDOL (ISOVUE-300) INJECTION 61%
100.0000 mL | Freq: Once | INTRAVENOUS | Status: AC | PRN
Start: 2015-12-09 — End: 2015-12-09
  Administered 2015-12-09: 100 mL via INTRAVENOUS

## 2016-03-31 NOTE — Progress Notes (Signed)
[place]  [phone] [phone]  ID: Kelly Murray OB: February 08, 1960  MR#: [id number]  [id number]  Patient Care Team: Steele Sizer, MD as PCP - General (Family Medicine)  CHIEF COMPLAINT: Pathologic stage IA ER PR positive adenocarcinoma of the upper outer quadrant right breast, stage III left kidney cancer status post nephrectomy.  INTERVAL HISTORY: Patient returns to clinic today for routine 6 month evaluation. She currently feels well and is asymptomatic. She has no neurologic complaints. She denies any recent fevers or illnesses. She has a good appetite and denies weight loss. She has no chest pain or shortness of breath. She denies any nausea, vomiting, constipation, or diarrhea. She has no urinary complaints. Patient feels at her baseline and offers no specific complaints today.  REVIEW OF SYSTEMS:   Review of Systems  Constitutional: Negative.  Negative for fever, malaise/fatigue and weight loss.  Respiratory: Negative.  Negative for cough and shortness of breath.   Cardiovascular: Negative.  Negative for chest pain and leg swelling.  Gastrointestinal: Negative.  Negative for abdominal pain.  Genitourinary: Negative.   Musculoskeletal: Negative.   Neurological: Negative.  Negative for weakness.  Psychiatric/Behavioral: Negative.  The patient is not nervous/anxious.     As per HPI. Otherwise, a complete review of systems is negative.  PAST MEDICAL HISTORY: Past Medical History:  Diagnosis Date  . Breast cancer ([place]) 2010   Right lumpectomy  . Cancer of kidney ([place]) 2015   Right  . History of colon polyps   . Personal history of radiation therapy 2010   BREAST CA    PAST SURGICAL HISTORY: Past Surgical History:  Procedure Laterality Date  . BREAST BIOPSY Right 2010   + radiation  . BREAST BIOPSY Right 2015   2 areas- stereo, neg  . COLONOSCOPY W/ POLYPECTOMY  08/13/2005  . COLONOSCOPY WITH PROPOFOL N/A 03/24/2015   Procedure: COLONOSCOPY WITH PROPOFOL;  Surgeon: Lollie Sails, MD;  Location: [facility] ENDOSCOPY;  Service: Endoscopy;  Laterality: N/A;  . NEPHRECTOMY Right     FAMILY HISTORY: Family History  Problem Relation Age of Onset  . Breast cancer Paternal Aunt 85  . Breast cancer Paternal Aunt 60       ADVANCED DIRECTIVES (Y/N):  N   HEALTH MAINTENANCE: Social History  Substance Use Topics  . Smoking status: Never Smoker  . Smokeless tobacco: Never Used  . Alcohol use No     Colonoscopy:  PAP:  Bone density:  Lipid panel:  Allergies  Allergen Reactions  . Iodine   . Shellfish Allergy     Current Outpatient Prescriptions  Medication Sig Dispense Refill  . cetirizine (ZYRTEC) 10 MG chewable tablet Chew 10 mg by mouth daily.    . calcium carbonate (TUMS EX) 750 MG chewable tablet Chew 1 tablet by mouth as needed for heartburn.    . cholecalciferol (VITAMIN D) 400 units TABS tablet Take 1,000 Units by mouth daily.     No current facility-administered medications for this visit.     OBJECTIVE: Vitals:   04/01/16 1540  BP: 128/85  Pulse: 76  Resp: 18     Body mass index is 24.9 kg/m.    ECOG FS:0 - Asymptomatic  General: Well-developed, well-nourished, no acute distress. Eyes: Pink conjunctiva, anicteric sclera. Breasts: Patient declined breast exam today. Lungs: Clear to auscultation bilaterally. Heart: Regular rate and rhythm. No rubs, murmurs, or gallops. Abdomen: Soft, nontender, nondistended. No organomegaly noted, normoactive bowel sounds. Musculoskeletal: No edema, cyanosis, or clubbing. Neuro:  Alert, answering all questions appropriately. Cranial nerves grossly intact. Skin: No rashes or petechiae noted. Psych: Normal affect.   LAB RESULTS:  Lab Results  Component Value Date   NA 137 04/01/2016   K 3.8 04/01/2016   CL 104 04/01/2016   CO2 27 04/01/2016   GLUCOSE 92 04/01/2016   BUN 19 04/01/2016   CREATININE 1.05 (H) 04/01/2016   CALCIUM 8.9  04/01/2016   PROT 7.4 04/01/2016   ALBUMIN 4.0 04/01/2016   AST 59 (H) 04/01/2016   ALT 37 04/01/2016   ALKPHOS 97 04/01/2016   BILITOT 0.4 04/01/2016   GFRNONAA 60 (L) 04/01/2016   GFRAA >60 04/01/2016    Lab Results  Component Value Date   WBC 4.9 04/01/2016   NEUTROABS 2.3 04/01/2016   HGB 12.8 04/01/2016   HCT 36.8 04/01/2016   MCV 92.9 04/01/2016   PLT 215 04/01/2016     STUDIES: No results found.  ASSESSMENT: Pathologic stage IA ER PR positive adenocarcinoma of the upper outer quadrant right breast, stage III left kidney cancer status post nephrectomy.  PLAN:    1.  Pathologic stage IA ER PR positive adenocarcinoma of the upper outer quadrant right breast: Patient completed 5 years of tamoxifen in approximately December 2015. Her most recent mammogram on October 16, 2015 was reported as BI-RADS 2, repeat in September 2018.  2. Stage III left kidney cancer: Patient is status post nephrectomy in March 2015. Restaging CT scan on December 09, 2015 did not reveal any evidence of recurrence. Recommendation is to do yearly scans 5 years post nephrectomy. After lengthy discussion with the patient, she wishes no further follow-up in the [place] and has requested that her primary care physician order these scans. 3. Disposition: No further follow-up has been scheduled at patient request.  Patient expressed understanding and was in agreement with this plan. She also understands that She can call clinic at any time with any questions, concerns, or complaints.   Cancer Staging Cancer of kidney [facility]) Staging form: Kidney, AJCC 7th Edition - Clinical: No stage assigned - Unsigned   Lloyd Huger, MD   04/05/2016 3:15 PM

## 2016-04-01 ENCOUNTER — Inpatient Hospital Stay (HOSPITAL_BASED_OUTPATIENT_CLINIC_OR_DEPARTMENT_OTHER): Payer: Self-pay | Admitting: Oncology

## 2016-04-01 ENCOUNTER — Inpatient Hospital Stay: Payer: Self-pay | Attending: Oncology

## 2016-04-01 VITALS — BP 128/85 | HR 76 | Resp 18 | Wt 159.0 lb

## 2016-04-01 DIAGNOSIS — Z86010 Personal history of colonic polyps: Secondary | ICD-10-CM | POA: Insufficient documentation

## 2016-04-01 DIAGNOSIS — C642 Malignant neoplasm of left kidney, except renal pelvis: Secondary | ICD-10-CM

## 2016-04-01 DIAGNOSIS — Z9223 Personal history of estrogen therapy: Secondary | ICD-10-CM

## 2016-04-01 DIAGNOSIS — Z170 Estrogen receptor positive status [ER+]: Secondary | ICD-10-CM

## 2016-04-01 DIAGNOSIS — Z79899 Other long term (current) drug therapy: Secondary | ICD-10-CM

## 2016-04-01 DIAGNOSIS — Z85528 Personal history of other malignant neoplasm of kidney: Secondary | ICD-10-CM

## 2016-04-01 DIAGNOSIS — Z905 Acquired absence of kidney: Secondary | ICD-10-CM

## 2016-04-01 DIAGNOSIS — Z853 Personal history of malignant neoplasm of breast: Secondary | ICD-10-CM

## 2016-04-01 DIAGNOSIS — C50411 Malignant neoplasm of upper-outer quadrant of right female breast: Secondary | ICD-10-CM

## 2016-04-01 DIAGNOSIS — Z923 Personal history of irradiation: Secondary | ICD-10-CM

## 2016-04-01 LAB — CBC WITH DIFFERENTIAL/PLATELET
BASOS ABS: 0 10*3/uL (ref 0–0.1)
BASOS PCT: 1 %
Eosinophils Absolute: 0.1 10*3/uL (ref 0–0.7)
Eosinophils Relative: 2 %
HEMATOCRIT: 36.8 % (ref 35.0–47.0)
HEMOGLOBIN: 12.8 g/dL (ref 12.0–16.0)
Lymphocytes Relative: 42 %
Lymphs Abs: 2.1 10*3/uL (ref 1.0–3.6)
MCH: 32.3 pg (ref 26.0–34.0)
MCHC: 34.7 g/dL (ref 32.0–36.0)
MCV: 92.9 fL (ref 80.0–100.0)
Monocytes Absolute: 0.4 10*3/uL (ref 0.2–0.9)
Monocytes Relative: 8 %
NEUTROS ABS: 2.3 10*3/uL (ref 1.4–6.5)
NEUTROS PCT: 47 %
Platelets: 215 10*3/uL (ref 150–440)
RBC: 3.96 MIL/uL (ref 3.80–5.20)
RDW: 12.8 % (ref 11.5–14.5)
WBC: 4.9 10*3/uL (ref 3.6–11.0)

## 2016-04-01 LAB — COMPREHENSIVE METABOLIC PANEL
ALBUMIN: 4 g/dL (ref 3.5–5.0)
ALK PHOS: 97 U/L (ref 38–126)
ALT: 37 U/L (ref 14–54)
AST: 59 U/L — ABNORMAL HIGH (ref 15–41)
Anion gap: 6 (ref 5–15)
BILIRUBIN TOTAL: 0.4 mg/dL (ref 0.3–1.2)
BUN: 19 mg/dL (ref 6–20)
CO2: 27 mmol/L (ref 22–32)
Calcium: 8.9 mg/dL (ref 8.9–10.3)
Chloride: 104 mmol/L (ref 101–111)
Creatinine, Ser: 1.05 mg/dL — ABNORMAL HIGH (ref 0.44–1.00)
GFR calc Af Amer: >60 mL/min (ref >60)
GFR calc non Af Amer: 60 mL/min — ABNORMAL LOW (ref >60)
GLUCOSE: 92 mg/dL (ref 65–99)
POTASSIUM: 3.8 mmol/L (ref 3.5–5.1)
Sodium: 137 mmol/L (ref 135–145)
TOTAL PROTEIN: 7.4 g/dL (ref 6.5–8.1)

## 2016-04-01 NOTE — Progress Notes (Signed)
Patient offers no complaints today.  States she has lost 50 lbs since last visit.

## 2016-09-16 ENCOUNTER — Ambulatory Visit (INDEPENDENT_AMBULATORY_CARE_PROVIDER_SITE_OTHER): Payer: BLUE CROSS/BLUE SHIELD | Admitting: Family Medicine

## 2016-09-16 ENCOUNTER — Encounter: Payer: Self-pay | Admitting: Family Medicine

## 2016-09-16 VITALS — BP 102/70 | HR 72 | Temp 97.8°F | Resp 16 | Ht 67.0 in | Wt 160.0 lb

## 2016-09-16 DIAGNOSIS — Z853 Personal history of malignant neoplasm of breast: Secondary | ICD-10-CM | POA: Insufficient documentation

## 2016-09-16 DIAGNOSIS — Z23 Encounter for immunization: Secondary | ICD-10-CM | POA: Diagnosis not present

## 2016-09-16 DIAGNOSIS — Z8249 Family history of ischemic heart disease and other diseases of the circulatory system: Secondary | ICD-10-CM | POA: Diagnosis not present

## 2016-09-16 DIAGNOSIS — E2839 Other primary ovarian failure: Secondary | ICD-10-CM | POA: Diagnosis not present

## 2016-09-16 DIAGNOSIS — Z90710 Acquired absence of both cervix and uterus: Secondary | ICD-10-CM | POA: Insufficient documentation

## 2016-09-16 DIAGNOSIS — Z0000 Encounter for general adult medical examination without abnormal findings: Secondary | ICD-10-CM

## 2016-09-16 DIAGNOSIS — E7800 Pure hypercholesterolemia, unspecified: Secondary | ICD-10-CM | POA: Diagnosis not present
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
## 2016-09-16 NOTE — Patient Instructions (Addendum)
Return between 2-6 months for Shingrix Vaccine.  Preventive Care 40-64 Years, Female Preventive care refers to lifestyle choices and visits with your health care provider that can promote health and wellness. What does preventive care include?  A yearly physical exam. This is also called an annual well check.  Dental exams once or twice a year.  Routine eye exams. Ask your health care provider how often you should have your eyes checked.  Personal lifestyle choices, including: ? Daily care of your teeth and gums. ? Regular physical activity. ? Eating a healthy diet. ? Avoiding tobacco and drug use. ? Limiting alcohol use. ? Practicing safe sex. ? Taking low-dose aspirin daily starting at age 13. ? Taking vitamin and mineral supplements as recommended by your health care provider. What happens during an annual well check? The services and screenings done by your health care provider during your annual well check will depend on your age, overall health, lifestyle risk factors, and family history of disease. Counseling Your health care provider may ask you questions about your:  Alcohol use.  Tobacco use.  Drug use.  Emotional well-being.  Home and relationship well-being.  Sexual activity.  Eating habits.  Work and work Statistician.  Method of birth control.  Menstrual cycle.  Pregnancy history.  Screening You may have the following tests or measurements:  Height, weight, and BMI.  Blood pressure.  Lipid and cholesterol levels. These may be checked every 5 years, or more frequently if you are over 57 years old.  Skin check.  Lung cancer screening. You may have this screening every year starting at age 42 if you have a 30-pack-year history of smoking and currently smoke or have quit within the past 15 years.  Fecal occult blood test (FOBT) of the stool. You may have this test every year starting at age 34.  Flexible sigmoidoscopy or colonoscopy. You may  have a sigmoidoscopy every 5 years or a colonoscopy every 10 years starting at age 26.  Hepatitis C blood test.  Hepatitis B blood test.  Sexually transmitted disease (STD) testing.  Diabetes screening. This is done by checking your blood sugar (glucose) after you have not eaten for a while (fasting). You may have this done every 1-3 years.  Mammogram. This may be done every 1-2 years. Talk to your health care provider about when you should start having regular mammograms. This may depend on whether you have a family history of breast cancer.  BRCA-related cancer screening. This may be done if you have a family history of breast, ovarian, tubal, or peritoneal cancers.  Pelvic exam and Pap test. This may be done every 3 years starting at age 51. Starting at age 33, this may be done every 5 years if you have a Pap test in combination with an HPV test.  Bone density scan. This is done to screen for osteoporosis. You may have this scan if you are at high risk for osteoporosis.  Discuss your test results, treatment options, and if necessary, the need for more tests with your health care provider. Vaccines Your health care provider may recommend certain vaccines, such as:  Influenza vaccine. This is recommended every year.  Tetanus, diphtheria, and acellular pertussis (Tdap, Td) vaccine. You may need a Td booster every 10 years.  Varicella vaccine. You may need this if you have not been vaccinated.  Zoster vaccine. You may need this after age 40.  Measles, mumps, and rubella (MMR) vaccine. You may need at least one  dose of MMR if you were born in 1957 or later. You may also need a second dose.  Pneumococcal 13-valent conjugate (PCV13) vaccine. You may need this if you have certain conditions and were not previously vaccinated.  Pneumococcal polysaccharide (PPSV23) vaccine. You may need one or two doses if you smoke cigarettes or if you have certain conditions.  Meningococcal vaccine.  You may need this if you have certain conditions.  Hepatitis A vaccine. You may need this if you have certain conditions or if you travel or work in places where you may be exposed to hepatitis A.  Hepatitis B vaccine. You may need this if you have certain conditions or if you travel or work in places where you may be exposed to hepatitis B.  Haemophilus influenzae type b (Hib) vaccine. You may need this if you have certain conditions.  Talk to your health care provider about which screenings and vaccines you need and how often you need them. This information is not intended to replace advice given to you by your health care provider. Make sure you discuss any questions you have with your health care provider. Document Released: 02/14/2015 Document Revised: 10/08/2015 Document Reviewed: 11/19/2014 Elsevier Interactive Patient Education  2017 [employer].

## 2016-09-22 ENCOUNTER — Other Ambulatory Visit: Payer: Self-pay

## 2016-09-22 DIAGNOSIS — R7989 Other specified abnormal findings of blood chemistry: Secondary | ICD-10-CM

## 2016-09-22 DIAGNOSIS — Z8249 Family history of ischemic heart disease and other diseases of the circulatory system: Secondary | ICD-10-CM

## 2016-09-22 DIAGNOSIS — R945 Abnormal results of liver function studies: Secondary | ICD-10-CM

## 2016-09-22 DIAGNOSIS — Z0000 Encounter for general adult medical examination without abnormal findings: Secondary | ICD-10-CM

## 2016-09-22 DIAGNOSIS — E7800 Pure hypercholesterolemia, unspecified: Secondary | ICD-10-CM

## 2016-09-22 DIAGNOSIS — Z853 Personal history of malignant neoplasm of breast: Secondary | ICD-10-CM

## 2016-09-22 NOTE — Addendum Note (Signed)
Addended by: Maura Crandall on: 09/22/2016 09:02 AM   Modules accepted: Orders

## 2016-09-23 LAB — COMPREHENSIVE METABOLIC PANEL
A/G RATIO: 1.4 (ref 1.2–2.2)
ALBUMIN: 4.1 g/dL (ref 3.5–5.5)
ALK PHOS: 148 IU/L — AB (ref 39–117)
ALT: 76 IU/L — ABNORMAL HIGH (ref 0–32)
AST: 44 IU/L — ABNORMAL HIGH (ref 0–40)
BILIRUBIN TOTAL: 0.3 mg/dL (ref 0.0–1.2)
BUN / CREAT RATIO: 14 (ref 9–23)
BUN: 14 mg/dL (ref 6–24)
CHLORIDE: 104 mmol/L (ref 96–106)
CO2: 28 mmol/L (ref 20–29)
CREATININE: 0.98 mg/dL (ref 0.57–1.00)
Calcium: 9.4 mg/dL (ref 8.7–10.2)
GFR calc Af Amer: 76 mL/min/{1.73_m2} (ref >59)
GFR calc non Af Amer: 66 mL/min/{1.73_m2} (ref >59)
GLOBULIN, TOTAL: 2.9 g/dL (ref 1.5–4.5)
Glucose: 92 mg/dL (ref 65–99)
Potassium: 4.6 mmol/L (ref 3.5–5.2)
Sodium: 144 mmol/L (ref 134–144)
Total Protein: 7 g/dL (ref 6.0–8.5)

## 2016-09-23 LAB — CBC WITH DIFFERENTIAL/PLATELET
Basophils Absolute: 0 10*3/uL (ref 0.0–0.2)
Basos: 1 %
EOS (ABSOLUTE): 0.1 10*3/uL (ref 0.0–0.4)
EOS: 3 %
HEMATOCRIT: 38.4 % (ref 34.0–46.6)
HEMOGLOBIN: 12.4 g/dL (ref 11.1–15.9)
Immature Grans (Abs): 0 10*3/uL (ref 0.0–0.1)
Immature Granulocytes: 0 %
LYMPHS ABS: 1.5 10*3/uL (ref 0.7–3.1)
Lymphs: 43 %
MCH: 30.9 pg (ref 26.6–33.0)
MCHC: 32.3 g/dL (ref 31.5–35.7)
MCV: 96 fL (ref 79–97)
MONOCYTES: 8 %
MONOS ABS: 0.3 10*3/uL (ref 0.1–0.9)
NEUTROS ABS: 1.6 10*3/uL (ref 1.4–7.0)
Neutrophils: 45 %
Platelets: 201 10*3/uL (ref 150–379)
RBC: 4.01 x10E6/uL (ref 3.77–5.28)
RDW: 13.5 % (ref 12.3–15.4)
WBC: 3.5 10*3/uL (ref 3.4–10.8)

## 2016-09-23 LAB — LIPID PANEL
CHOL/HDL RATIO: 2.7 ratio (ref 0.0–4.4)
Cholesterol, Total: 236 mg/dL — ABNORMAL HIGH (ref 100–199)
HDL: 87 mg/dL (ref >39)
LDL Calculated: 136 mg/dL — ABNORMAL HIGH (ref 0–99)
TRIGLYCERIDES: 65 mg/dL (ref 0–149)
VLDL CHOLESTEROL CAL: 13 mg/dL (ref 5–40)

## 2016-09-23 NOTE — Addendum Note (Signed)
Addended by: Hubbard Hartshorn on: 09/23/2016 10:35 AM   Modules accepted: Orders

## 2016-09-27 ENCOUNTER — Telehealth: Payer: Self-pay | Admitting: Family Medicine

## 2016-09-27 NOTE — Telephone Encounter (Signed)
Pt calling checking status on her mammogram appt. She stated that she is not able to make the appt herself due to her previously having breast cancer. Please return call [phone]

## 2016-09-28 NOTE — Telephone Encounter (Signed)
Informed pt about mammogram appointment, she then wanted to know about the bone density. Stated that the doctor wanted her to have them at the same time. [phone]

## 2016-09-28 NOTE — Telephone Encounter (Signed)
Patient has been scheduled to have her mammogram on Monday, October 25, 2016 @ 2:40pm [place] [facility]).  If this appointment date/ time does not work please have patient call [phone].  Thanks

## 2016-09-28 NOTE — Telephone Encounter (Signed)
Pt informed to call [place] that she can schedule her bone density exam herself. Pt verbalized understanding

## 2016-09-29 NOTE — Addendum Note (Signed)
Addended by: Inda Coke on: 09/29/2016 02:37 PM   Modules accepted: Orders

## 2016-10-20 ENCOUNTER — Telehealth: Payer: Self-pay | Admitting: Family Medicine

## 2016-10-20 NOTE — Telephone Encounter (Signed)
-----   Message from Hubbard Hartshorn, [place] sent at 10/05/2016 10:31 AM EDT ----- Regarding: Repeat Labs Please call patient and remind her to come in for repeat labs in the next few days. Thanks!

## 2016-10-21 NOTE — Telephone Encounter (Signed)
Left message for patient

## 2016-10-25 ENCOUNTER — Ambulatory Visit
Admission: RE | Admit: 2016-10-25 | Discharge: 2016-10-25 | Disposition: A | Discharge status: Home / Self Care | Payer: BLUE CROSS/BLUE SHIELD | Source: Ambulatory Visit | Attending: Family Medicine | Admitting: Family Medicine

## 2016-10-25 DIAGNOSIS — Z853 Personal history of malignant neoplasm of breast: Secondary | ICD-10-CM

## 2016-10-25 DIAGNOSIS — Z1231 Encounter for screening mammogram for malignant neoplasm of breast: Secondary | ICD-10-CM | POA: Diagnosis not present

## 2016-11-10 ENCOUNTER — Ambulatory Visit
Admission: RE | Admit: 2016-11-10 | Discharge: 2016-11-10 | Disposition: A | Discharge status: Home / Self Care | Payer: BLUE CROSS/BLUE SHIELD | Source: Ambulatory Visit | Attending: Family Medicine | Admitting: Family Medicine

## 2016-11-10 DIAGNOSIS — Z13820 Encounter for screening for osteoporosis: Secondary | ICD-10-CM | POA: Diagnosis not present

## 2016-11-10 DIAGNOSIS — Z853 Personal history of malignant neoplasm of breast: Secondary | ICD-10-CM

## 2016-11-10 DIAGNOSIS — Z90710 Acquired absence of both cervix and uterus: Secondary | ICD-10-CM

## 2016-11-10 DIAGNOSIS — E2839 Other primary ovarian failure: Secondary | ICD-10-CM | POA: Insufficient documentation

## 2016-11-10 DIAGNOSIS — M8588 Other specified disorders of bone density and structure, other site: Secondary | ICD-10-CM | POA: Diagnosis not present

## 2016-11-10 DIAGNOSIS — Z0000 Encounter for general adult medical examination without abnormal findings: Secondary | ICD-10-CM

## 2016-11-10 DIAGNOSIS — Z780 Asymptomatic menopausal state: Secondary | ICD-10-CM | POA: Diagnosis not present

## 2016-11-10 DIAGNOSIS — M8589 Other specified disorders of bone density and structure, multiple sites: Secondary | ICD-10-CM | POA: Diagnosis not present

## 2016-11-15 ENCOUNTER — Other Ambulatory Visit: Payer: Self-pay | Admitting: Family Medicine

## 2016-11-15 DIAGNOSIS — M8580 Other specified disorders of bone density and structure, unspecified site: Secondary | ICD-10-CM

## 2016-11-16 ENCOUNTER — Ambulatory Visit: Payer: BLUE CROSS/BLUE SHIELD

## 2016-11-24 ENCOUNTER — Telehealth: Payer: Self-pay

## 2016-11-24 ENCOUNTER — Telehealth: Payer: Self-pay | Admitting: Family Medicine

## 2016-11-24 ENCOUNTER — Ambulatory Visit: Payer: Self-pay

## 2016-11-24 DIAGNOSIS — Z23 Encounter for immunization: Secondary | ICD-10-CM

## 2016-11-24 NOTE — Telephone Encounter (Signed)
Please call patient again and remind her to come in for labs. Please also find out when her [place] is scheduled. Thanks!

## 2016-11-24 NOTE — Telephone Encounter (Signed)
Left message for patient

## 2016-11-24 NOTE — Telephone Encounter (Signed)
Copied from [place] #[id number]. Topic: Quick Communication - Office Called Patient >> Nov 24, 2016  4:25 PM Malena Catholic I, [place] wrote: Reason for CRM: pt called said the some one call her

## 2016-12-14 ENCOUNTER — Other Ambulatory Visit: Payer: Self-pay | Admitting: Family Medicine

## 2016-12-14 DIAGNOSIS — M8580 Other specified disorders of bone density and structure, unspecified site: Secondary | ICD-10-CM

## 2016-12-14 NOTE — Progress Notes (Signed)
Pt came to clinic requesting labs be ordered through [facility] instead of [facility].

## 2016-12-15 ENCOUNTER — Other Ambulatory Visit: Payer: Self-pay

## 2016-12-15 DIAGNOSIS — Z0000 Encounter for general adult medical examination without abnormal findings: Secondary | ICD-10-CM

## 2016-12-16 LAB — VITAMIN D 25 HYDROXY (VIT D DEFICIENCY, FRACTURES): VIT D 25 HYDROXY: 27.8 ng/mL — AB (ref 30.0–100.0)

## 2016-12-16 LAB — PARATHYROID HORMONE, INTACT (NO CA): PTH: 28 pg/mL (ref 15–65)

## 2016-12-16 LAB — TSH: TSH: 1.3 u[IU]/mL (ref 0.450–4.500)

## 2017-07-28 ENCOUNTER — Encounter: Payer: Self-pay | Admitting: Adult Health

## 2017-07-28 ENCOUNTER — Ambulatory Visit: Payer: Self-pay | Admitting: Adult Health

## 2017-07-28 VITALS — BP 122/86 | HR 93 | Temp 98.6°F | Resp 16 | Ht 67.0 in | Wt 176.0 lb

## 2017-07-28 DIAGNOSIS — R059 Cough, unspecified: Secondary | ICD-10-CM

## 2017-07-28 DIAGNOSIS — H66001 Acute suppurative otitis media without spontaneous rupture of ear drum, right ear: Secondary | ICD-10-CM

## 2017-07-28 DIAGNOSIS — R05 Cough: Secondary | ICD-10-CM

## 2017-07-28 MED ORDER — AMOXICILLIN-POT CLAVULANATE 875-125 MG PO TABS: 1.0000 | ORAL_TABLET | Freq: Two times a day (BID) | ORAL | 0 refills | Status: DC

## 2017-07-28 NOTE — Patient Instructions (Signed)
Otitis Media, Adult Otitis media is redness, soreness, and puffiness (swelling) in the space just behind your eardrum (middle ear). It may be caused by allergies or infection. It often happens along with a cold. Follow these instructions at home:  Take your medicine as told. Finish it even if you start to feel better.  Only take over-the-counter or prescription medicines for pain, discomfort, or fever as told by your doctor.  Follow up with your doctor as told. Contact a doctor if:  You have otitis media only in one ear, or bleeding from your nose, or both.  You notice a lump on your neck.  You are not getting better in 3-5 days.  You feel worse instead of better. Get help right away if:  You have pain that is not helped with medicine.  You have puffiness, redness, or pain around your ear.  You get a stiff neck.  You cannot move part of your face (paralysis).  You notice that the bone behind your ear hurts when you touch it. This information is not intended to replace advice given to you by your health care provider. Make sure you discuss any questions you have with your health care provider. Document Released: 07/07/2007 Document Revised: 06/26/2015 Document Reviewed: 08/15/2012 Elsevier Interactive Patient Education  2017 Elsevier Inc.  

## 2017-07-28 NOTE — Progress Notes (Signed)
Subjective:     Patient ID: Kelly Murray, female   DOB: [birth year], 57 y.o.   MRN: [id number]  HPI Blood pressure 122/86, pulse 93, temperature 98.6 F (37 C), resp. rate 16, height 5\' 7"  (1.702 m), weight 176 lb (79.8 kg), SpO2 100 %.   Patient is a 57 year old female in no acute distress who comes to the clinic with cough, congestion, fever x 4 days.Sinus pressure. Coughing up green sputum. 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 She reports was in a building that was hot. Ear pressure.   She reports two or three other ladies that cleaned the dorm with her also are sick now.  She reports this dorm room was disgustingly dirty.   Patient denies any body aches,chills, rash, chest pain, shortness of breath, nausea, vomiting, or diarrhea.    Review of Systems  Constitutional: Positive for fatigue. Negative for activity change, appetite change, chills, diaphoresis, fever and unexpected weight change.  HENT: Positive for congestion, ear pain, postnasal drip, rhinorrhea, sinus pressure and sore throat. Negative for dental problem, drooling, ear discharge, facial swelling, hearing loss, mouth sores, nosebleeds, sinus pain, sneezing, tinnitus, trouble swallowing and voice change.   Respiratory: Positive for cough. Negative for apnea, choking, chest tightness, shortness of breath, wheezing and stridor.   Cardiovascular: Negative.   Gastrointestinal: Negative.   Endocrine: Negative.   Genitourinary: Negative.   Musculoskeletal: Negative.   Skin: Negative.   Allergic/Immunologic: Positive for environmental allergies and food allergies. Negative for immunocompromised state.        -- Iodine   -- Shellfish Allergy -- Nausea And Vomiting   Neurological: Negative.   Hematological: Negative.   Psychiatric/Behavioral: Negative.        Objective:   Physical Exam  Constitutional: She is oriented to person, place, and time. Vital signs are normal. She appears  well-developed and well-nourished. She is active.  Non-toxic appearance. She does not have a sickly appearance. She does not appear ill. No distress.  Patient is alert and oriented and responsive to questions Engages in eye contact with provider. Speaks in full sentences without any pauses without any shortness of breath or distress.   HENT:  Head: Normocephalic and atraumatic.  Right Ear: Hearing and external ear normal. Tympanic membrane is erythematous. Tympanic membrane is not perforated. A middle ear effusion is present.  Left Ear: Hearing and external ear normal. Tympanic membrane is erythematous. Tympanic membrane is not perforated. A middle ear effusion is present.  Nose: Mucosal edema and rhinorrhea present. Right sinus exhibits maxillary sinus tenderness. Right sinus exhibits no frontal sinus tenderness. Left sinus exhibits maxillary sinus tenderness. Left sinus exhibits no frontal sinus tenderness.  Mouth/Throat: Uvula is midline and mucous membranes are normal. Posterior oropharyngeal erythema present. No oropharyngeal exudate, posterior oropharyngeal edema or tonsillar abscesses. Tonsils are 2+ on the right. Tonsils are 1+ on the left. No tonsillar exudate.  Eyes: Pupils are equal, round, and reactive to light. Conjunctivae, EOM and lids are normal. Right eye exhibits no discharge. Left eye exhibits no discharge. No scleral icterus.  Neck: Trachea normal, normal range of motion, full passive range of motion without pain and phonation normal. Neck supple. Normal carotid pulses, no hepatojugular reflux and no JVD present. Carotid bruit is not present. No tracheal deviation present. No Brudzinski's sign noted.  Cardiovascular: Normal rate, regular rhythm, S1 normal, S2 normal, normal heart sounds and intact distal pulses. Exam reveals no gallop, no distant heart sounds and no  friction rub.  No murmur heard. Pulmonary/Chest: Effort normal and breath sounds normal. No stridor. No respiratory  distress. She has no wheezes. She has no rales. She exhibits no tenderness.  Abdominal: Soft. Normal aorta and bowel sounds are normal. There is no tenderness.  Musculoskeletal: Normal range of motion.  Lymphadenopathy:       Head (right side): No submental, no submandibular, no tonsillar, no preauricular, no posterior auricular and no occipital adenopathy present.       Head (left side): No submental, no submandibular, no tonsillar, no preauricular, no posterior auricular and no occipital adenopathy present.    She has no cervical adenopathy.  Neurological: She is alert and oriented to person, place, and time. She has normal strength. She displays normal reflexes. No cranial nerve deficit. She exhibits normal muscle tone. Coordination normal. GCS eye subscore is 4. GCS verbal subscore is 5. GCS motor subscore is 6.  Skin: Skin is warm, dry and intact. Capillary refill takes less than 2 seconds. No rash noted. She is not diaphoretic. No erythema. No pallor. Nails show no clubbing.  Psychiatric: She has a normal mood and affect. Her speech is normal and behavior is normal. Judgment and thought content normal. Cognition and memory are normal.  Vitals reviewed.      Assessment:     ,Non-recurrent acute suppurative otitis media of right ear without spontaneous rupture of tympanic membrane  Cough      Plan:     Meds ordered this encounter  Medications  . amoxicillin-clavulanate (AUGMENTIN) 875-125 MG tablet    Sig: Take 1 tablet by mouth 2 (two) times daily.    Dispense:  20 tablet    Refill:  0   Advised patient call the office or your primary care doctor for an appointment if no improvement within 72 hours or if any symptoms change or worsen at any time  Advised ER or urgent Care if after hours or on weekend. Call 911 for emergency symptoms at any time.Patinet verbalized understanding of all instructions given/reviewed and treatment plan and has no further questions or concerns at this  time.    Patient verbalized understanding of all instructions given and denies any further questions at this time.

## 2017-07-29 ENCOUNTER — Ambulatory Visit: Payer: Self-pay | Admitting: Adult Health

## 2017-08-23 IMAGING — MG MM DIGITAL DIAGNOSTIC BILAT W/ TOMO W/ CAD
8 of 19 series · 8 of 40 positions shown · non-contrast
Comparison: Previous exam(s).

CLINICAL DATA: Patient status post right breast lumpectomy 0666.

EXAM:
2D DIGITAL DIAGNOSTIC BILATERAL MAMMOGRAM WITH CAD AND ADJUNCT TOMO

[R CC (1 of 2)]
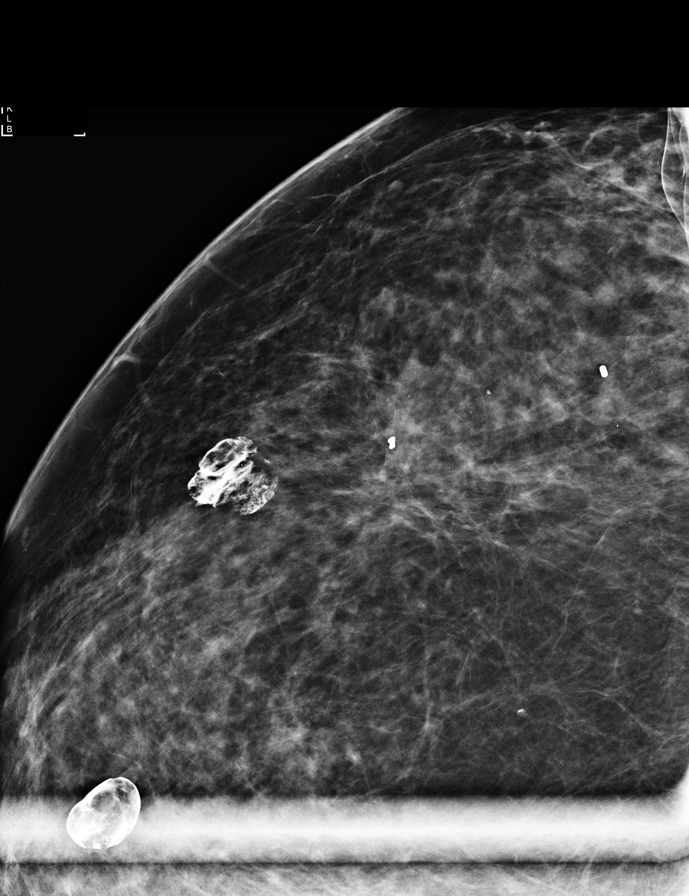

[L MLO (1 of 2)]
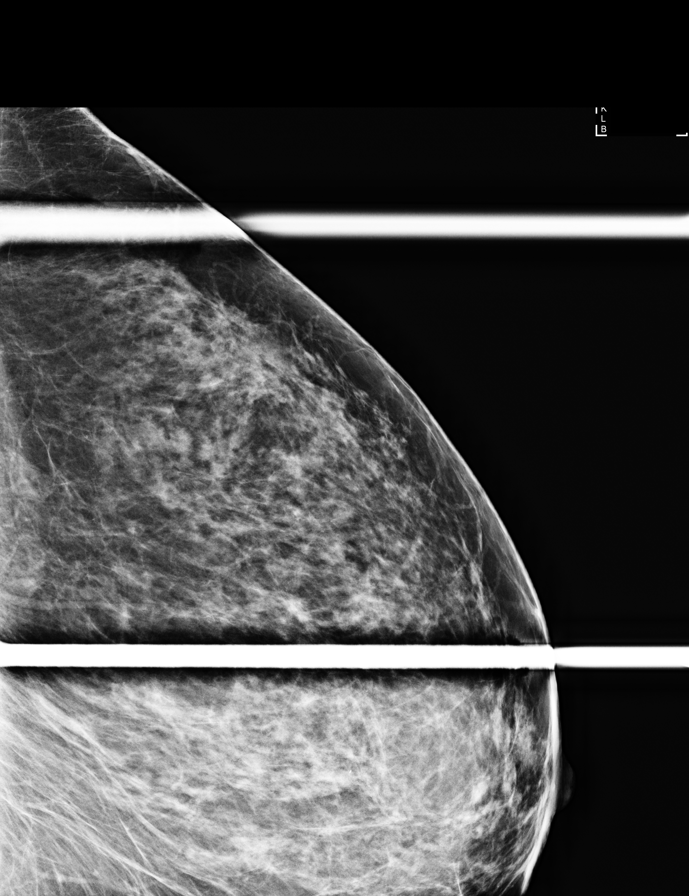

[L CC]
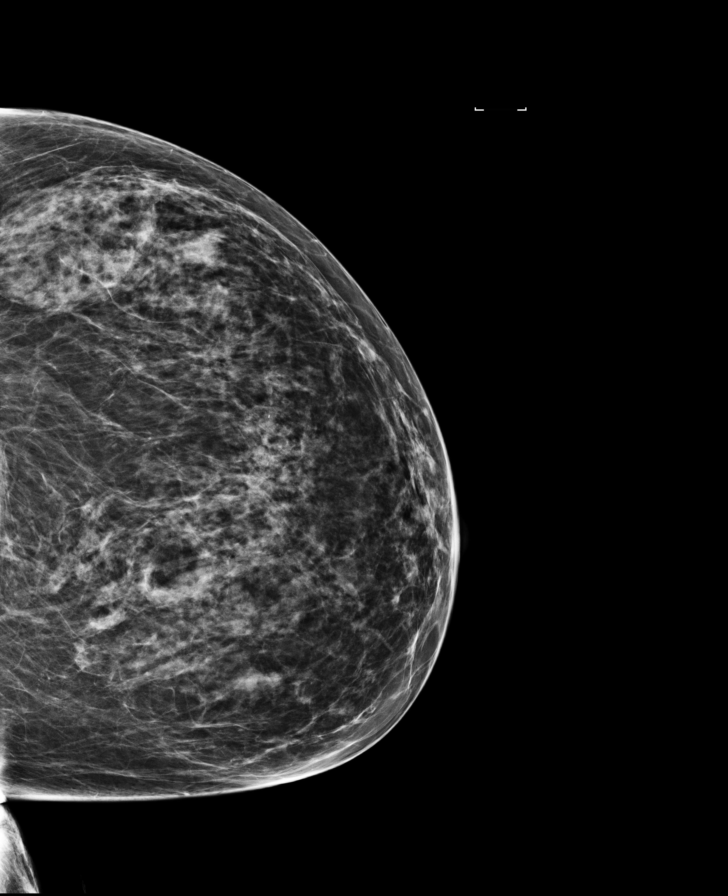

[L ML]
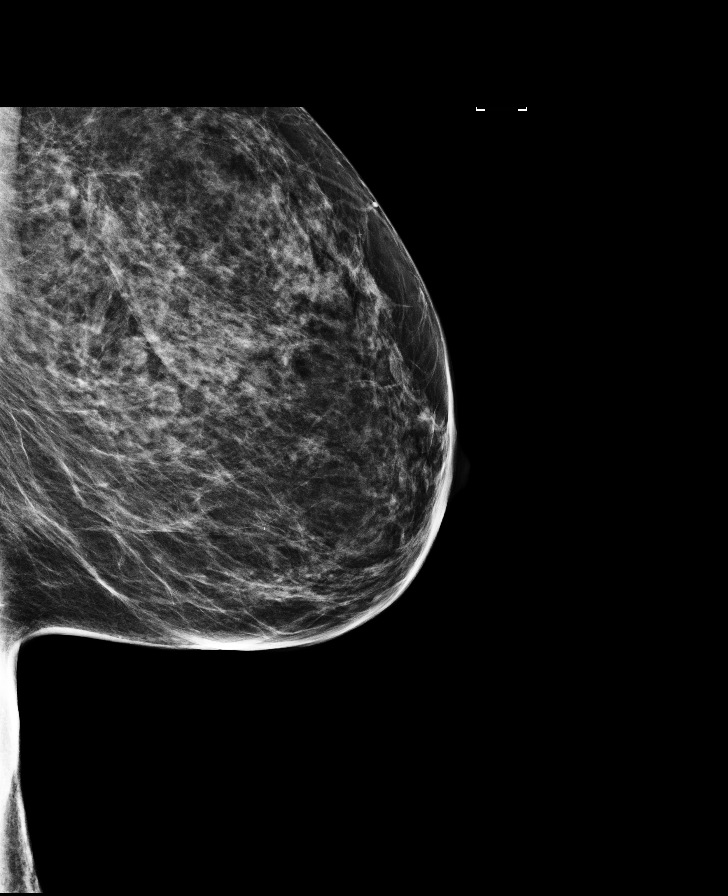

[L MLO synth-2D]
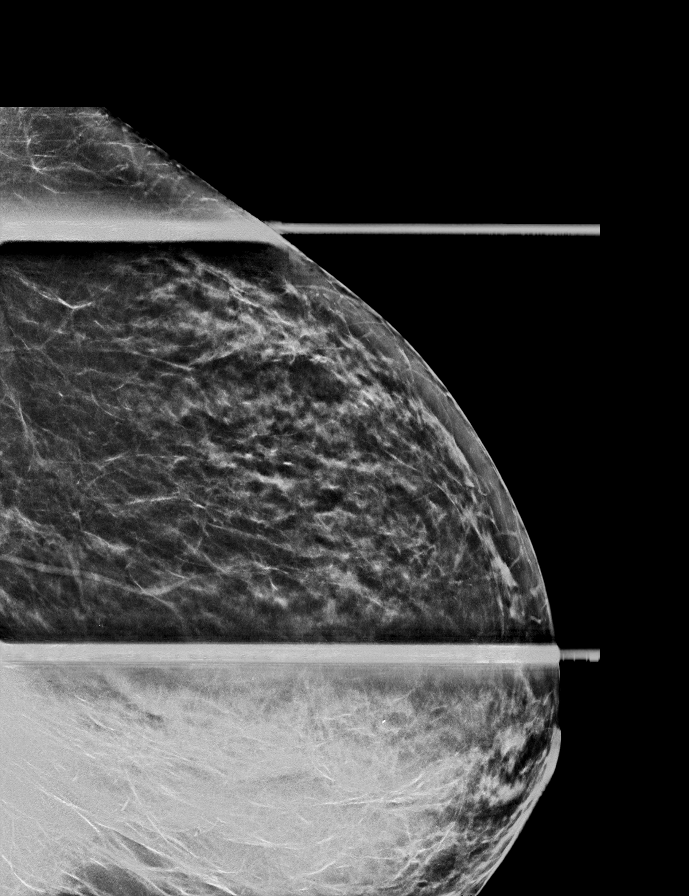

[L MLO (2 of 2)]
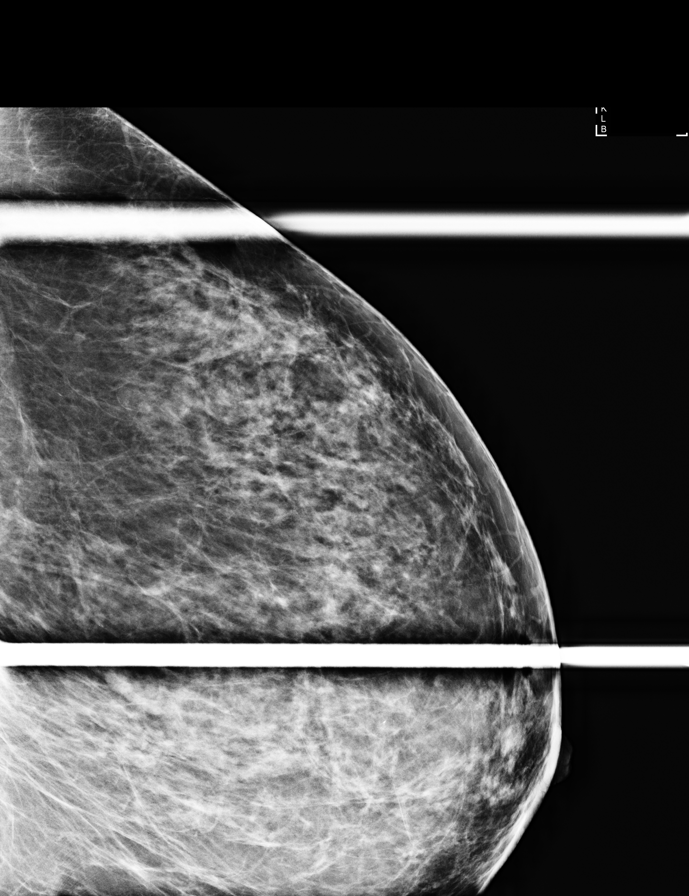

[R MLO]
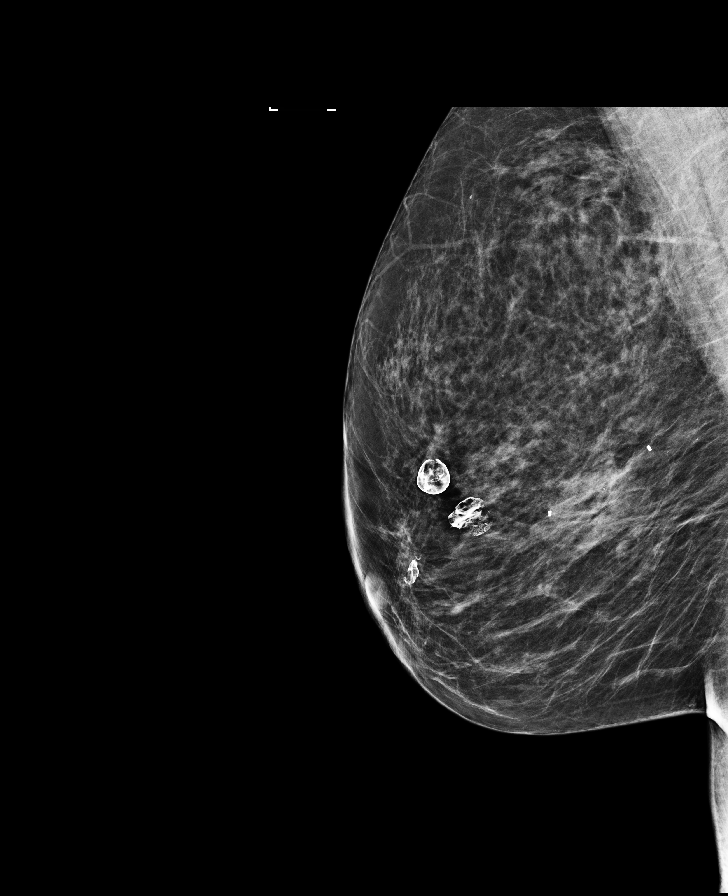

[R CC (2 of 2)]
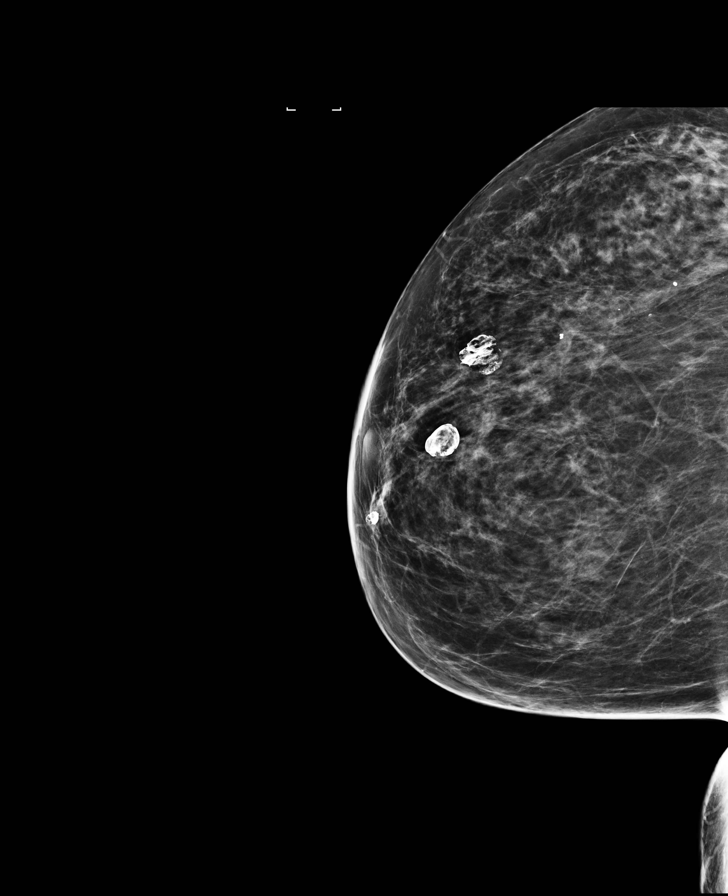

[8 of 40 positions shown; findings below may reference images not displayed]

ACR Breast Density Category c: The breast tissue is heterogeneously
dense, which may obscure small masses.
FINDINGS: Stable postlumpectomy changes right breast. No concerning masses,
calcifications or nonsurgical architectural distortion identified
within either breast.

Mammographic images were processed with CAD.
IMPRESSION: No mammographic evidence for malignancy. Stable lumpectomy changes
right breast.

RECOMMENDATION:
Screening mammogram in one year.(Code:51-T-MYM)

I have discussed the findings and recommendations with the patient.
Results were also provided in writing at the conclusion of the
visit. If applicable, a reminder letter will be sent to the patient
regarding the next appointment.

BI-RADS CATEGORY  2: Benign.

## 2017-09-29 ENCOUNTER — Other Ambulatory Visit: Payer: Self-pay | Admitting: Family Medicine

## 2017-09-29 DIAGNOSIS — Z1231 Encounter for screening mammogram for malignant neoplasm of breast: Secondary | ICD-10-CM

## 2017-10-26 ENCOUNTER — Ambulatory Visit
Admission: RE | Admit: 2017-10-26 | Discharge: 2017-10-26 | Disposition: A | Discharge status: Home / Self Care | Payer: BLUE CROSS/BLUE SHIELD | Source: Ambulatory Visit | Attending: Family Medicine | Admitting: Family Medicine

## 2017-10-26 DIAGNOSIS — Z1231 Encounter for screening mammogram for malignant neoplasm of breast: Secondary | ICD-10-CM | POA: Diagnosis not present

## 2018-07-07 DIAGNOSIS — Z20828 Contact with and (suspected) exposure to other viral communicable diseases: Secondary | ICD-10-CM | POA: Diagnosis not present

## 2018-09-25 ENCOUNTER — Other Ambulatory Visit: Payer: Self-pay | Admitting: Family Medicine

## 2018-09-26 ENCOUNTER — Other Ambulatory Visit: Payer: Self-pay | Admitting: Family Medicine

## 2018-09-26 DIAGNOSIS — Z1231 Encounter for screening mammogram for malignant neoplasm of breast: Secondary | ICD-10-CM

## 2018-10-11 ENCOUNTER — Ambulatory Visit: Payer: Self-pay | Admitting: Nurse Practitioner

## 2018-10-11 ENCOUNTER — Other Ambulatory Visit: Payer: Self-pay

## 2018-10-13 ENCOUNTER — Ambulatory Visit: Payer: Self-pay | Admitting: Nurse Practitioner

## 2018-10-13 ENCOUNTER — Other Ambulatory Visit: Payer: Self-pay

## 2018-10-31 ENCOUNTER — Ambulatory Visit
Admission: RE | Admit: 2018-10-31 | Discharge: 2018-10-31 | Disposition: A | Discharge status: Home / Self Care | Payer: BC Managed Care – PPO | Source: Ambulatory Visit | Attending: Family Medicine | Admitting: Family Medicine

## 2018-10-31 DIAGNOSIS — Z1231 Encounter for screening mammogram for malignant neoplasm of breast: Secondary | ICD-10-CM | POA: Diagnosis not present

## 2018-11-27 ENCOUNTER — Ambulatory Visit: Payer: Self-pay

## 2018-11-27 ENCOUNTER — Other Ambulatory Visit: Payer: Self-pay

## 2018-11-27 DIAGNOSIS — Z23 Encounter for immunization: Secondary | ICD-10-CM

## 2019-04-23 ENCOUNTER — Telehealth: Payer: Self-pay | Admitting: *Deleted

## 2019-04-23 NOTE — Telephone Encounter (Signed)
Katharine Look called [facility] with concerns about her 57yo son. She states he is depressed and has been talking about "killing himself a lot more lately". She further states he lives in [place]. After consultation with H.Ratcliffe PA-C advised Janyne to take her son to the ED at [facility] or [facility] in [place] and let them know he is suicidal at check in. Further advised he will be evaluated both physically and mentally and proper treatment can be started. He can be admitted to the hospital if needed and get treatment started right away and upon d/c he will be given resources for continued treatment. Strongly encouraged her if her son will not go with her to ED to call the local law enforcement in [place] and let them know he is suicidal as they can take out Involuntary Commitment Papers that will allow them to take him to the ED and he will not be able to leave until he is properly evaluated by a doctor. This is for his safety as we do not want him to act on the suicidal thoughts. I highly encouraged her to do this today as this will be the fastest way to get him help and keep him safe. Asked Sheena if she has any questions about what I have told her,she says she does not and verbalizes understanding of topics discussed.

## 2019-05-16 ENCOUNTER — Other Ambulatory Visit: Payer: Self-pay

## 2019-05-16 ENCOUNTER — Encounter: Payer: Self-pay | Admitting: Family Medicine

## 2019-05-16 ENCOUNTER — Ambulatory Visit (INDEPENDENT_AMBULATORY_CARE_PROVIDER_SITE_OTHER): Payer: BC Managed Care – PPO | Admitting: Family Medicine

## 2019-05-16 VITALS — BP 136/88 | HR 86 | Temp 97.3°F | Resp 16 | Ht 66.5 in | Wt 191.1 lb

## 2019-05-16 DIAGNOSIS — Z23 Encounter for immunization: Secondary | ICD-10-CM

## 2019-05-16 DIAGNOSIS — Z85528 Personal history of other malignant neoplasm of kidney: Secondary | ICD-10-CM

## 2019-05-16 DIAGNOSIS — Z0000 Encounter for general adult medical examination without abnormal findings: Secondary | ICD-10-CM | POA: Diagnosis not present

## 2019-05-16 DIAGNOSIS — E669 Obesity, unspecified: Secondary | ICD-10-CM

## 2019-05-16 DIAGNOSIS — Z905 Acquired absence of kidney: Secondary | ICD-10-CM | POA: Diagnosis not present

## 2019-05-16 DIAGNOSIS — Z1159 Encounter for screening for other viral diseases: Secondary | ICD-10-CM | POA: Diagnosis not present

## 2019-05-16 DIAGNOSIS — Z114 Encounter for screening for human immunodeficiency virus [HIV]: Secondary | ICD-10-CM

## 2019-05-16 DIAGNOSIS — Z853 Personal history of malignant neoplasm of breast: Secondary | ICD-10-CM

## 2019-05-16 MED ORDER — VITAMIN D 50 MCG (2000 UT) PO CAPS: 1.0000 | ORAL_CAPSULE | Freq: Every day | ORAL | 0 refills | Status: DC

## 2019-05-16 NOTE — Progress Notes (Signed)
.Name: Kelly Murray   MRN: [id number]    DOB: December 25, 1960   Date:05/16/2019       Progress Note  Subjective  Chief Complaint  Chief Complaint  Patient presents with  . Annual Exam    HPI  Patient presents for annual CPE  History of right kidney cancer s/nephroctomy: she is not seeing  Nephrologist or Urologist. She denies hematuria of flank pain, she denies taking nsaid's and has good urine output  History of breast cancer: it was on right side, s/p lumpectomy , used to see Dr. Grayland Ormond, lost to follow up. She states it costs too much. She is up to date with mammograms. She is due for mammogram in 10/2018   Diet: she is doing well , balanced diet  Exercise: she was not active because she broke her elbow 10/2018 at work, and just recently has been back to work and activity.   USPSTF grade A and B recommendations    Office Visit from 05/16/2019 in [facility]  AUDIT-C Score  0     Depression: Phq 9 is  negative Depression screen [facility] 2/9 05/16/2019 09/16/2016  Decreased Interest 0 0  Down, Depressed, Hopeless 0 0  PHQ - 2 Score 0 0  Altered sleeping 0 -  Tired, decreased energy 0 -  Change in appetite 0 -  Feeling bad or failure about yourself  0 -  Trouble concentrating 0 -  Moving slowly or fidgety/restless 0 -  Suicidal thoughts 0 -  PHQ-9 Score 0 -   Hypertension: BP Readings from Last 3 Encounters:  05/16/19 136/88  07/28/17 122/86  09/16/16 102/70   Obesity: Wt Readings from Last 3 Encounters:  05/16/19 191 lb 1.6 oz (86.7 kg)  07/28/17 176 lb (79.8 kg)  09/16/16 160 lb (72.6 kg)   BMI Readings from Last 3 Encounters:  05/16/19 30.38 kg/m  07/28/17 27.57 kg/m  09/16/16 25.06 kg/m     Hep C Screening: today  STD testing and prevention (HIV/chl/gon/syphilis): not interested  Intimate partner violence: negative screen  Sexual History (Partners/Practices/Protection from [employer] hx STI/Pregnancy Plans): homsexual same partner past 11  years, no STI, nothing goes in her vagina  Menstrual History/LMP/Abnormal Bleeding: s/p hysterectomy  Incontinence Symptoms: no problems   Breast cancer:  - Last Mammogram: 10/2018 - BRCA gene screening:  Negative genetic test   Osteoporosis: Discussed high calcium and vitamin D supplementation, weight bearing exercises  Cervical cancer screening: not indicated   Skin cancer: Discussed monitoring for atypical lesions  Colorectal cancer: up to date   Lung cancer:   Low Dose CT Chest recommended if Age 106-80 years, 30 pack-year currently smoking OR have quit w/in 15years. Patient does not qualify.   ECG: 2018   Advanced Care Planning: A voluntary discussion about advance care planning including the explanation and discussion of advance directives.  Discussed health care proxy and Living will, and the patient was able to identify a health care proxy as Seward Speck - her partner .  Patient does  have a living will at present time. If patient does have living will, I have requested they bring this to the clinic to be scanned in to their chart.  Lipids: Lab Results  Component Value Date   CHOL 236 (H) 09/22/2016   CHOL 215 (H) 02/08/2012   Lab Results  Component Value Date   HDL 87 09/22/2016   HDL 58 02/08/2012   Lab Results  Component Value Date   LDLCALC 136 (H)  09/22/2016   LDLCALC 138 (H) 02/08/2012   Lab Results  Component Value Date   TRIG 65 09/22/2016   TRIG 93 02/08/2012   Lab Results  Component Value Date   CHOLHDL 2.7 09/22/2016   No results found for: LDLDIRECT  Glucose: Glucose  Date Value Ref Range Status  09/22/2016 92 65 - 99 mg/dL Final  02/27/2014 90 65 - 99 mg/dL Final  12/26/2013 77 65 - 99 mg/dL Final  08/29/2013 103 (H) 65 - 99 mg/dL Final   Glucose, Bld  Date Value Ref Range Status  04/01/2016 92 65 - 99 mg/dL Final  09/29/2015 102 (H) 65 - 99 mg/dL Final  08/29/2014 99 65 - 99 mg/dL Final    Patient Active Problem List   Diagnosis Date  Noted  . Osteopenia 11/15/2016  . History of breast cancer in adulthood 09/16/2016  . History of total hysterectomy 09/16/2016  . Primary cancer of upper outer quadrant of right female breast ([place]) 09/28/2015  . Breast cancer ([place]) 02/07/2015  . Malignant neoplasm of kidney excluding renal pelvis ([place]) 08/29/2014    Past Surgical History:  Procedure Laterality Date  . BREAST BIOPSY Right 2010   + radiation  . BREAST BIOPSY Right 2015   2 areas- stereo, neg  . BREAST LUMPECTOMY Right 2010  . COLONOSCOPY W/ POLYPECTOMY  08/13/2005  . COLONOSCOPY WITH PROPOFOL N/A 03/24/2015   Procedure: COLONOSCOPY WITH PROPOFOL;  Surgeon: Lollie Sails, MD;  Location: [facility] ENDOSCOPY;  Service: Endoscopy;  Laterality: N/A;  . NEPHRECTOMY Right     Family History  Problem Relation Age of Onset  . Breast cancer Paternal Aunt 23  . Breast cancer Paternal Aunt 34    Social History   Socioeconomic History  . Marital status: Single    Spouse name: Not on file  . Number of children: Not on file  . Years of education: Not on file  . Highest education level: Not on file  Occupational History  . Not on file  Tobacco Use  . Smoking status: Never Smoker  . Smokeless tobacco: Never Used  Substance and Sexual Activity  . Alcohol use: No  . Drug use: No  . Sexual activity: Not on file  Other Topics Concern  . Not on file  Social History Narrative  . Not on file   Social Determinants of Health   Financial Resource Strain: Low Risk   . Difficulty of Paying Living Expenses: Not hard at all  Food Insecurity: No Food Insecurity  . Worried About Charity fundraiser in the Last Year: Never true  . Ran Out of Food in the Last Year: Never true  Transportation Needs: No Transportation Needs  . Lack of Transportation (Medical): No  . Lack of Transportation (Non-Medical): No  Physical Activity: Sufficiently Active  . Days of Exercise per Week: 4 days  . Minutes of Exercise per Session: 60 min   Stress: No Stress Concern Present  . Feeling of Stress : Not at all  Social Connections: Unknown  . Frequency of Communication with Friends and Family: Twice a week  . Frequency of Social Gatherings with Friends and Family: Once a week  . Attends Religious Services: More than 4 times per year  . Active Member of Clubs or Organizations: No  . Attends Archivist Meetings: Never  . Marital Status: Not on file  Intimate Partner Violence: Not At Risk  . Fear of Current or Ex-Partner: No  . Emotionally Abused: No  .  Physically Abused: No  . Sexually Abused: No     Current Outpatient Medications:  .  cetirizine (ZYRTEC) 10 MG chewable tablet, Chew 10 mg by mouth daily., Disp: , Rfl:  .  cholecalciferol (VITAMIN D) 400 units TABS tablet, Take 1,000 Units by mouth daily., Disp: , Rfl:  .  amoxicillin-clavulanate (AUGMENTIN) 875-125 MG tablet, Take 1 tablet by mouth 2 (two) times daily. (Patient not taking: Reported on 05/16/2019), Disp: 20 tablet, Rfl: 0  Allergies  Allergen Reactions  . Iodine   . Shellfish Allergy Nausea And Vomiting     ROS  Constitutional: Negative for fever or weight change.  Respiratory: Negative for cough and shortness of breath.   Cardiovascular: Negative for chest pain or palpitations.  Gastrointestinal: Negative for abdominal pain, no bowel changes.  Musculoskeletal: Negative for gait problem or joint swelling.  Skin: Negative for rash.  Neurological: Negative for dizziness or headache.  No other specific complaints in a complete review of systems (except as listed in HPI above).  Objective  Vitals:   05/16/19 1338  BP: 136/88  Pulse: 86  Resp: 16  Temp: (!) 97.3 F (36.3 C)  TempSrc: Temporal  SpO2: 98%  Weight: 191 lb 1.6 oz (86.7 kg)  Height: 5' 6.5" (1.689 m)    Body mass index is 30.38 kg/m.  Physical Exam  Constitutional: Patient appears well-developed and well-nourished. No distress.  HENT: Head: Normocephalic and  atraumatic. Ears: B TMs ok, no erythema or effusion; Nose: Nose normal. Mouth/Throat: Oropharynx is clear and moist. No oropharyngeal exudate.  Eyes: Conjunctivae and EOM are normal. Pupils are equal, round, and reactive to light. No scleral icterus.  Neck: Normal range of motion. Neck supple. No JVD present. No thyromegaly present.  Cardiovascular: Normal rate, regular rhythm and normal heart sounds.  No murmur heard. No BLE edema. Pulmonary/Chest: Effort normal and breath sounds normal. No respiratory distress. Abdominal: Soft. Bowel sounds are normal, no distension. There is no tenderness. no masses Breast: no lumps or masses, no nipple discharge or rashes FEMALE GENITALIA:  Not done RECTAL:not done Musculoskeletal: Normal range of motion, no joint effusions. No gross deformities Neurological: he is alert and oriented to person, place, and time. No cranial nerve deficit. Coordination, balance, strength, speech and gait are normal.  Skin: Skin is warm and dry. No rash noted. No erythema.  Psychiatric: Patient has a normal mood and affect. behavior is normal. Judgment and thought content normal.  Fall Risk: Fall Risk  05/16/2019 09/16/2016  Falls in the past year? 0 No  Number falls in past yr: 0 -  Injury with Fall? 0 -    Functional Status Survey: Is the patient deaf or have difficulty hearing?: No Does the patient have difficulty seeing, even when wearing glasses/contacts?: No Does the patient have difficulty concentrating, remembering, or making decisions?: No Does the patient have difficulty walking or climbing stairs?: No Does the patient have difficulty dressing or bathing?: No Does the patient have difficulty doing errands alone such as visiting a doctor's office or shopping?: No   Assessment & Plan  1. Well adult exam  - Cholecalciferol (VITAMIN D) 50 MCG (2000 UT) CAPS; Take 1 capsule (2,000 Units total) by mouth daily.  Dispense: 30 capsule; Refill: 0 - COMPLETE  METABOLIC PANEL WITH GFR - CBC with Differential/Platelet - Lipid panel - Hemoglobin A1c  2. Encounter for screening for HIV  - HIV Antibody (routine testing w rflx)  3. Need for hepatitis C screening test  - Hepatitis  C antibody  4. History of kidney cancer  - Urinalysis, microscopic only  5. History of cancer of right breast   6. History of right nephrectomy  - Urinalysis, microscopic only  7. Need for shingles vaccine  - Varicella-zoster vaccine IM  8. Obesity (BMI 30.0-34.9)   -USPSTF grade A and B recommendations reviewed with patient; age-appropriate recommendations, preventive care, screening tests, etc discussed and encouraged; healthy living encouraged; see AVS for patient education given to patient -Discussed importance of 150 minutes of physical activity weekly, eat two servings of fish weekly, eat one serving of tree nuts ( cashews, pistachios, pecans, almonds.Marland Kitchen) every other day, eat 6 servings of fruit/vegetables daily and drink plenty of water and avoid sweet beverages.

## 2019-05-16 NOTE — Patient Instructions (Signed)

## 2019-05-16 NOTE — Addendum Note (Signed)
Addended by: Jacqulynn Cadet on: 05/16/2019 04:25 PM   Modules accepted: Orders

## 2019-05-17 ENCOUNTER — Other Ambulatory Visit: Payer: Self-pay

## 2019-05-17 DIAGNOSIS — R739 Hyperglycemia, unspecified: Secondary | ICD-10-CM

## 2019-05-17 DIAGNOSIS — Z0000 Encounter for general adult medical examination without abnormal findings: Secondary | ICD-10-CM

## 2019-05-17 NOTE — Addendum Note (Signed)
Addended by: Beckie Salts A on: 05/17/2019 07:57 AM   Modules accepted: Orders

## 2019-05-18 LAB — CBC WITH DIFFERENTIAL/PLATELET
Basophils Absolute: 0 10*3/uL (ref 0.0–0.2)
Basos: 0 %
EOS (ABSOLUTE): 0.1 10*3/uL (ref 0.0–0.4)
Eos: 1 %
Hematocrit: 41.2 % (ref 34.0–46.6)
Hemoglobin: 13.6 g/dL (ref 11.1–15.9)
Immature Grans (Abs): 0 10*3/uL (ref 0.0–0.1)
Immature Granulocytes: 0 %
Lymphocytes Absolute: 1.5 10*3/uL (ref 0.7–3.1)
Lymphs: 20 %
MCH: 32.3 pg (ref 26.6–33.0)
MCHC: 33 g/dL (ref 31.5–35.7)
MCV: 98 fL — ABNORMAL HIGH (ref 79–97)
Monocytes Absolute: 0.6 10*3/uL (ref 0.1–0.9)
Monocytes: 8 %
Neutrophils Absolute: 5.2 10*3/uL (ref 1.4–7.0)
Neutrophils: 71 %
Platelets: 211 10*3/uL (ref 150–450)
RBC: 4.21 x10E6/uL (ref 3.77–5.28)
RDW: 12.5 % (ref 11.7–15.4)
WBC: 7.5 10*3/uL (ref 3.4–10.8)

## 2019-05-18 LAB — COMPREHENSIVE METABOLIC PANEL
ALT: 34 IU/L — ABNORMAL HIGH (ref 0–32)
AST: 38 IU/L (ref 0–40)
Albumin/Globulin Ratio: 1.4 (ref 1.2–2.2)
Albumin: 4.2 g/dL (ref 3.8–4.9)
Alkaline Phosphatase: 123 IU/L — ABNORMAL HIGH (ref 39–117)
BUN/Creatinine Ratio: 17 (ref 9–23)
BUN: 18 mg/dL (ref 6–24)
Bilirubin Total: 0.4 mg/dL (ref 0.0–1.2)
CO2: 23 mmol/L (ref 20–29)
Calcium: 9.2 mg/dL (ref 8.7–10.2)
Chloride: 102 mmol/L (ref 96–106)
Creatinine, Ser: 1.05 mg/dL — ABNORMAL HIGH (ref 0.57–1.00)
GFR calc Af Amer: 69 mL/min/{1.73_m2} (ref >59)
GFR calc non Af Amer: 60 mL/min/{1.73_m2} (ref >59)
Globulin, Total: 3.1 g/dL (ref 1.5–4.5)
Glucose: 98 mg/dL (ref 65–99)
Potassium: 4.5 mmol/L (ref 3.5–5.2)
Sodium: 138 mmol/L (ref 134–144)
Total Protein: 7.3 g/dL (ref 6.0–8.5)

## 2019-05-18 LAB — URINALYSIS, ROUTINE W REFLEX MICROSCOPIC
Bilirubin, UA: NEGATIVE
Glucose, UA: NEGATIVE
Ketones, UA: NEGATIVE
Leukocytes,UA: NEGATIVE
Nitrite, UA: NEGATIVE
Protein,UA: NEGATIVE
RBC, UA: NEGATIVE
Specific Gravity, UA: 1.009 (ref 1.005–1.030)
Urobilinogen, Ur: 0.2 mg/dL (ref 0.2–1.0)
pH, UA: 6.5 (ref 5.0–7.5)

## 2019-05-18 LAB — HIV ANTIBODY (ROUTINE TESTING W REFLEX): HIV Screen 4th Generation wRfx: NONREACTIVE

## 2019-05-18 LAB — LIPID PANEL WITH LDL/HDL RATIO
Cholesterol, Total: 250 mg/dL — ABNORMAL HIGH (ref 100–199)
HDL: 73 mg/dL (ref >39)
LDL Chol Calc (NIH): 166 mg/dL — ABNORMAL HIGH (ref 0–99)
LDL/HDL Ratio: 2.3 ratio (ref 0.0–3.2)
Triglycerides: 69 mg/dL (ref 0–149)
VLDL Cholesterol Cal: 11 mg/dL (ref 5–40)

## 2019-05-18 LAB — HEMOGLOBIN A1C
Est. average glucose Bld gHb Est-mCnc: 111 mg/dL
Hgb A1c MFr Bld: 5.5 % (ref 4.8–5.6)

## 2019-05-18 LAB — HEPATITIS C ANTIBODY: Hep C Virus Ab: 0.1 s/co ratio (ref 0.0–0.9)

## 2019-06-15 ENCOUNTER — Ambulatory Visit: Payer: Self-pay | Attending: Internal Medicine

## 2019-06-15 DIAGNOSIS — Z23 Encounter for immunization: Secondary | ICD-10-CM

## 2019-06-15 NOTE — Progress Notes (Signed)
   Covid-19 Vaccination Clinic  Name:  Kelly Murray    MRN: [id number] DOB: 07/05/1960  06/15/2019  [name] was observed post Covid-19 immunization for 15 minutes without incident. She was provided with Vaccine Information Sheet and instruction to access the V-Safe system.   [name] was instructed to call 911 with any severe reactions post vaccine: Marland Kitchen Difficulty breathing  . Swelling of face and throat  . A fast heartbeat  . A bad rash all over body  . Dizziness and weakness   Immunizations Administered    Name Date Dose VIS Date Route   Pfizer COVID-19 Vaccine 06/15/2019  9:22 AM 0.3 mL 03/28/2018 Intramuscular   Manufacturer: [place]   Lot: [id number]   [place]: [id number]

## 2019-07-07 ENCOUNTER — Ambulatory Visit: Payer: Self-pay | Attending: Internal Medicine

## 2019-07-07 DIAGNOSIS — Z23 Encounter for immunization: Secondary | ICD-10-CM

## 2019-07-07 NOTE — Progress Notes (Signed)
   Covid-19 Vaccination Clinic  Name:  Kelly Murray    MRN: [id number] DOB: 06-19-1960  07/07/2019  Kelly Murray was observed post Covid-19 immunization for 15 minutes without incident. She was provided with Vaccine Information Sheet and instruction to access the V-Safe system.   Kelly Murray was instructed to call 911 with any severe reactions post vaccine: Marland Kitchen Difficulty breathing  . Swelling of face and throat  . A fast heartbeat  . A bad rash all over body  . Dizziness and weakness   Immunizations Administered    Name Date Dose VIS Date Route   Pfizer COVID-19 Vaccine 07/07/2019  8:39 AM 0.3 mL 03/28/2018 Intramuscular   Manufacturer: [employer]   Lot: [id number]   [place]: [id number]

## 2019-07-10 ENCOUNTER — Ambulatory Visit: Payer: Self-pay

## 2019-09-26 ENCOUNTER — Other Ambulatory Visit: Payer: Self-pay | Admitting: Family Medicine

## 2019-09-26 DIAGNOSIS — Z1231 Encounter for screening mammogram for malignant neoplasm of breast: Secondary | ICD-10-CM

## 2019-11-05 ENCOUNTER — Other Ambulatory Visit: Payer: Self-pay

## 2019-11-05 ENCOUNTER — Ambulatory Visit
Admission: RE | Admit: 2019-11-05 | Discharge: 2019-11-05 | Disposition: A | Discharge status: Home / Self Care | Payer: BC Managed Care – PPO | Source: Ambulatory Visit | Attending: Family Medicine | Admitting: Family Medicine

## 2019-11-05 DIAGNOSIS — Z1231 Encounter for screening mammogram for malignant neoplasm of breast: Secondary | ICD-10-CM | POA: Insufficient documentation

## 2019-12-31 DIAGNOSIS — Z03818 Encounter for observation for suspected exposure to other biological agents ruled out: Secondary | ICD-10-CM | POA: Diagnosis not present

## 2020-08-28 ENCOUNTER — Telehealth: Payer: Self-pay

## 2020-08-28 NOTE — Telephone Encounter (Signed)
Copied from [place] [id number]. Topic: General - Other >> Aug 28, 2020  9:07 AM Holley Dexter N wrote: Reason for CRM: Pt called in wanting to get a print out of her last CPE, pt states she has a coloscopy coming up and they need to see it. Pt requested a call when ready to be picked up. Please advise.

## 2020-08-28 NOTE — Telephone Encounter (Signed)
Note printed. Pt aware and will pick up today.

## 2020-09-22 ENCOUNTER — Ambulatory Visit: Payer: Self-pay | Admitting: Medical

## 2020-09-22 ENCOUNTER — Encounter: Payer: Self-pay | Admitting: Medical

## 2020-09-22 ENCOUNTER — Other Ambulatory Visit: Payer: Self-pay

## 2020-09-22 VITALS — BP 110/72 | HR 81 | Temp 97.9°F | Resp 16

## 2020-09-22 DIAGNOSIS — H6502 Acute serous otitis media, left ear: Secondary | ICD-10-CM

## 2020-09-22 MED ORDER — AZITHROMYCIN 250 MG PO TABS: ORAL_TABLET | ORAL | 0 refills | Status: AC

## 2020-09-22 NOTE — Progress Notes (Signed)
   Subjective:    Patient ID: Kelly Murray, female    DOB: [birth year], 60 y.o.   MRN: [id number]  HPI 60 yo female in non acute distress.Started on Thursday of last week worsening on Friday, "it feels better today I have been using Xlear, once a day" denies fever or chills, cough or chest pain, no nasal discharge. Pain on and off in the left ear.    Allergies  Allergen Reactions   Iodine    Shellfish Allergy Nausea And Vomiting   Ibuprofen more then one day the patient states she gets a rash on her skin takes Tylenol instead   H/O Kidney cancer removal of right kidney Breast right side  lumpectomy  Blood pressure 110/72, pulse 81, temperature 97.9 F (36.6 C), temperature source Oral, resp. rate 16, SpO2 98 %.   Review of Systems  Constitutional:  Negative for chills and fever.  HENT:  Positive for congestion and postnasal drip. Negative for rhinorrhea and sore throat.   Respiratory:  Negative for cough and shortness of breath.   Cardiovascular:  Negative for chest pain.  Gastrointestinal:  Negative for abdominal pain and diarrhea.  Neurological:  Positive for headaches (mild in the morning on the left forehead). Negative for dizziness, syncope and light-headedness.      Objective:   Physical Exam Constitutional:      Appearance: Normal appearance.  HENT:     Head: Normocephalic and atraumatic.     Right Ear: Ear canal and external ear normal. A middle ear effusion is present.     Left Ear: Ear canal and external ear normal. Tympanic membrane is erythematous.     Mouth/Throat:     Lips: Pink.     Mouth: Mucous membranes are moist.     Pharynx: Oropharynx is clear.     Tonsils: 2+ on the right. 2+ on the left.  Eyes:     Extraocular Movements: Extraocular movements intact.     Conjunctiva/sclera: Conjunctivae normal.     Pupils: Pupils are equal, round, and reactive to light.  Cardiovascular:     Rate and Rhythm: Normal rate and regular rhythm.  Pulmonary:      Effort: Pulmonary effort is normal.     Breath sounds: Normal breath sounds.  Musculoskeletal:        General: Normal range of motion.     Cervical back: Normal range of motion and neck supple.  Skin:    General: Skin is warm and dry.  Neurological:     General: No focal deficit present.     Mental Status: She is alert and oriented to person, place, and time.  Psychiatric:        Mood and Affect: Mood normal.        Behavior: Behavior normal.        Thought Content: Thought content normal.        Judgment: Judgment normal.    Non smoker      Assessment & Plan:  Otitis Media left Eustachian tube dysfunction right Continue  Xlear. Meds ordered this encounter  Medications   azithromycin (ZITHROMAX) 250 MG tablet    Sig: Take 2 tablets on day 1, then 1 tablet daily on days 2 through 5, take with food    Dispense:  6 tablet    Refill:  0   Avoid water in ear may cause pain. Return in 3-7 days if not improving patient verbalizes understanding and has no questions at discharge.

## 2020-10-01 ENCOUNTER — Other Ambulatory Visit: Payer: Self-pay

## 2020-10-01 ENCOUNTER — Telehealth: Payer: Self-pay

## 2020-10-01 DIAGNOSIS — Z853 Personal history of malignant neoplasm of breast: Secondary | ICD-10-CM

## 2020-10-01 DIAGNOSIS — Z1231 Encounter for screening mammogram for malignant neoplasm of breast: Secondary | ICD-10-CM

## 2020-10-01 NOTE — Telephone Encounter (Signed)
Copied from [place] [id number]. Topic: General - Other >> Sep 30, 2020  4:28 PM Antonieta Iba C wrote: Reason for CRM: pt called in to request to have a order placed at [facility] for her mammogram. Pt says that she was told to speak with her provider. Pt is unsure why.   Pt would like further assistance.

## 2020-10-01 NOTE — Telephone Encounter (Signed)
Order placed in EPIC.

## 2020-10-15 ENCOUNTER — Other Ambulatory Visit: Payer: Self-pay | Admitting: Pediatric Dentistry

## 2020-10-15 ENCOUNTER — Other Ambulatory Visit: Payer: Self-pay | Admitting: Family Medicine

## 2020-10-15 DIAGNOSIS — Z1231 Encounter for screening mammogram for malignant neoplasm of breast: Secondary | ICD-10-CM

## 2020-11-05 ENCOUNTER — Other Ambulatory Visit: Payer: Self-pay

## 2020-11-05 ENCOUNTER — Ambulatory Visit
Admission: RE | Admit: 2020-11-05 | Discharge: 2020-11-05 | Disposition: A | Discharge status: Home / Self Care | Payer: BC Managed Care – PPO | Source: Ambulatory Visit | Attending: Family Medicine | Admitting: Family Medicine

## 2020-11-05 DIAGNOSIS — Z1231 Encounter for screening mammogram for malignant neoplasm of breast: Secondary | ICD-10-CM | POA: Diagnosis not present

## 2020-11-25 DIAGNOSIS — Z86010 Personal history of colonic polyps: Secondary | ICD-10-CM | POA: Diagnosis not present

## 2020-12-15 DIAGNOSIS — M9907 Segmental and somatic dysfunction of upper extremity: Secondary | ICD-10-CM | POA: Diagnosis not present

## 2020-12-15 DIAGNOSIS — M9903 Segmental and somatic dysfunction of lumbar region: Secondary | ICD-10-CM | POA: Diagnosis not present

## 2020-12-15 DIAGNOSIS — M9901 Segmental and somatic dysfunction of cervical region: Secondary | ICD-10-CM | POA: Diagnosis not present

## 2020-12-15 DIAGNOSIS — M9902 Segmental and somatic dysfunction of thoracic region: Secondary | ICD-10-CM | POA: Diagnosis not present

## 2020-12-16 DIAGNOSIS — M9903 Segmental and somatic dysfunction of lumbar region: Secondary | ICD-10-CM | POA: Diagnosis not present

## 2020-12-16 DIAGNOSIS — M9902 Segmental and somatic dysfunction of thoracic region: Secondary | ICD-10-CM | POA: Diagnosis not present

## 2020-12-16 DIAGNOSIS — M9907 Segmental and somatic dysfunction of upper extremity: Secondary | ICD-10-CM | POA: Diagnosis not present

## 2020-12-16 DIAGNOSIS — M9901 Segmental and somatic dysfunction of cervical region: Secondary | ICD-10-CM | POA: Diagnosis not present

## 2020-12-23 DIAGNOSIS — M9901 Segmental and somatic dysfunction of cervical region: Secondary | ICD-10-CM | POA: Diagnosis not present

## 2020-12-23 DIAGNOSIS — M9903 Segmental and somatic dysfunction of lumbar region: Secondary | ICD-10-CM | POA: Diagnosis not present

## 2020-12-23 DIAGNOSIS — M9907 Segmental and somatic dysfunction of upper extremity: Secondary | ICD-10-CM | POA: Diagnosis not present

## 2020-12-23 DIAGNOSIS — M9902 Segmental and somatic dysfunction of thoracic region: Secondary | ICD-10-CM | POA: Diagnosis not present

## 2020-12-29 DIAGNOSIS — M9907 Segmental and somatic dysfunction of upper extremity: Secondary | ICD-10-CM | POA: Diagnosis not present

## 2020-12-29 DIAGNOSIS — M9901 Segmental and somatic dysfunction of cervical region: Secondary | ICD-10-CM | POA: Diagnosis not present

## 2020-12-29 DIAGNOSIS — M9902 Segmental and somatic dysfunction of thoracic region: Secondary | ICD-10-CM | POA: Diagnosis not present

## 2020-12-29 DIAGNOSIS — M9903 Segmental and somatic dysfunction of lumbar region: Secondary | ICD-10-CM | POA: Diagnosis not present

## 2020-12-30 DIAGNOSIS — M9901 Segmental and somatic dysfunction of cervical region: Secondary | ICD-10-CM | POA: Diagnosis not present

## 2020-12-30 DIAGNOSIS — M9907 Segmental and somatic dysfunction of upper extremity: Secondary | ICD-10-CM | POA: Diagnosis not present

## 2020-12-30 DIAGNOSIS — M9903 Segmental and somatic dysfunction of lumbar region: Secondary | ICD-10-CM | POA: Diagnosis not present

## 2020-12-30 DIAGNOSIS — M9902 Segmental and somatic dysfunction of thoracic region: Secondary | ICD-10-CM | POA: Diagnosis not present

## 2021-01-06 DIAGNOSIS — M9907 Segmental and somatic dysfunction of upper extremity: Secondary | ICD-10-CM | POA: Diagnosis not present

## 2021-01-06 DIAGNOSIS — M9902 Segmental and somatic dysfunction of thoracic region: Secondary | ICD-10-CM | POA: Diagnosis not present

## 2021-01-06 DIAGNOSIS — M9903 Segmental and somatic dysfunction of lumbar region: Secondary | ICD-10-CM | POA: Diagnosis not present

## 2021-01-06 DIAGNOSIS — M9901 Segmental and somatic dysfunction of cervical region: Secondary | ICD-10-CM | POA: Diagnosis not present

## 2021-01-08 ENCOUNTER — Other Ambulatory Visit: Payer: BC Managed Care – PPO

## 2021-01-12 DIAGNOSIS — Z86010 Personal history of colonic polyps: Secondary | ICD-10-CM | POA: Diagnosis not present

## 2021-01-12 DIAGNOSIS — K640 First degree hemorrhoids: Secondary | ICD-10-CM | POA: Diagnosis not present

## 2021-01-12 DIAGNOSIS — K5730 Diverticulosis of large intestine without perforation or abscess without bleeding: Secondary | ICD-10-CM | POA: Diagnosis not present

## 2021-01-15 ENCOUNTER — Ambulatory Visit: Payer: BC Managed Care – PPO | Admitting: Family

## 2021-01-15 ENCOUNTER — Encounter: Payer: Self-pay | Admitting: Nurse Practitioner

## 2021-01-15 ENCOUNTER — Other Ambulatory Visit: Payer: Self-pay

## 2021-01-15 VITALS — HR 97

## 2021-01-15 DIAGNOSIS — U071 COVID-19: Secondary | ICD-10-CM

## 2021-01-15 MED ORDER — ALBUTEROL SULFATE HFA 108 (90 BASE) MCG/ACT IN AERS: 2.0000 | INHALATION_SPRAY | RESPIRATORY_TRACT | 0 refills | Status: DC | PRN

## 2021-01-15 MED ORDER — PROMETHAZINE-DM 6.25-15 MG/5ML PO SYRP: ORAL_SOLUTION | ORAL | 0 refills | Status: DC

## 2021-01-15 MED ORDER — AZITHROMYCIN 250 MG PO TABS: ORAL_TABLET | ORAL | 0 refills | Status: AC

## 2021-01-15 NOTE — Progress Notes (Signed)
° °  Subjective:    Patient ID: Kelly Murray, female    DOB: September 14, 1960, 60 y.o.   MRN: [id number]  Pt presented via phone with c/o Covid 19.  Pt tested positive yesterday, 01/14/21.  Sxs started 01/14/21.  Pt had colonoscopy 01/12/21.  Congestion, head pressure, fever, body aches cough. No otc meds.  Hx of breast CA 11 years ago and Kidney CA 5 years ago. No prescription meds.     Review of Systems  Constitutional:  Positive for activity change, appetite change, chills, fatigue and fever.  HENT:  Positive for congestion and sinus pressure.   Eyes: Negative.   Respiratory:  Positive for cough and chest tightness.   Cardiovascular: Negative.   Gastrointestinal: Negative.      Today's Vitals   01/15/21 0935  Pulse: 97  SpO2: 97%   There is no height or weight on file to calculate BMI.    Objective:   Physical Exam Constitutional:      Appearance: She is ill-appearing.  Eyes:     General: Scleral icterus: promethazine.  Pulmonary:     Comments: Mild upper lobe wheeze, clears with coughing.  Lower lobes cta. Neurological:     Mental Status: She is alert.       Assessment & Plan:   1. 2019 novel coronavirus disease (COVID-19) - promethazine-dextromethorphan (PROMETHAZINE-DM) 6.25-15 MG/5ML syrup; 41ml po q6 hours prn cough  Dispense: 75 mL; Refill: 0 - azithromycin (ZITHROMAX) 250 MG tablet; Take 2 tablets on day 1, then 1 tablet daily on days 2 through 5  Dispense: 6 tablet; Refill: 0 - albuterol (VENTOLIN HFA) 108 (90 Base) MCG/ACT inhaler; Inhale 2 puffs into the lungs every 4 (four) hours as needed for wheezing or shortness of breath.  Dispense: 8 g; Refill: 0   Reviewed otc supportive care. Hydrate and rest.  Follow up with any persistent sxs or concerns.  Plan shared with Marigene Ehlers, NP to follow up tomorrow.  Work note sent to HR for pts work absences. Supervisor Ms. Larene Beach with [employer]

## 2021-01-21 ENCOUNTER — Other Ambulatory Visit: Payer: Self-pay

## 2021-01-21 ENCOUNTER — Ambulatory Visit: Payer: BC Managed Care – PPO | Admitting: Nurse Practitioner

## 2021-01-21 VITALS — BP 110/82 | HR 87 | Temp 97.6°F | Resp 20

## 2021-01-21 DIAGNOSIS — U071 COVID-19: Secondary | ICD-10-CM

## 2021-01-21 NOTE — Progress Notes (Signed)
° °  Subjective:    Patient ID: Kelly Murray, female    DOB: [birth year], 60 y.o.   MRN: [id number]  HPI 60 year old female returning to clinic for [place] follow up.  She tested positive on 01/15/21  She just finished her Zpack, congestion and cough are resolving.   She has had COVID vaccine x4  Returning to clinic with complaints of fatigue and headache ongoing since COVID She did take ibuprofen today for relief.      Today's Vitals   01/21/21 1453  BP: 110/82  Pulse: 87  Resp: 20  Temp: 97.6 F (36.4 C)  TempSrc: Tympanic  SpO2: 99%   There is no height or weight on file to calculate BMI.    Review of Systems  Constitutional:  Positive for fatigue.  HENT:  Positive for congestion.   Eyes: Negative.   Respiratory: Negative.    Cardiovascular: Negative.   Gastrointestinal: Negative.   Genitourinary: Negative.   Musculoskeletal: Negative.   Neurological:  Positive for headaches.      Objective:   Physical Exam Constitutional:      Appearance: She is ill-appearing.  HENT:     Head: Normocephalic.     Right Ear: Tympanic membrane, ear canal and external ear normal.     Left Ear: Tympanic membrane, ear canal and external ear normal.     Nose: Congestion present.     Mouth/Throat:     Mouth: Mucous membranes are moist.     Pharynx: Oropharynx is clear.  Eyes:     Pupils: Pupils are equal, round, and reactive to light.  Cardiovascular:     Rate and Rhythm: Normal rate and regular rhythm.     Heart sounds: Normal heart sounds.  Pulmonary:     Effort: Pulmonary effort is normal.     Breath sounds: Normal breath sounds.  Musculoskeletal:     Cervical back: Normal range of motion.  Skin:    General: Skin is warm.  Neurological:     General: No focal deficit present.     Mental Status: She is alert.          Assessment & Plan:  1. COVID-19 Outside of window for anti-viral at this point. Symptoms are headache and fatigue Instructed to continue low dose  ibuprofen, and alternate with tylenol for HA.  Also start nasal spray to assist with nasal congestion  OTC decongestant for relief of ongoing sinus congestion as well   Push fluids  Rest  Stay out from work this week   RTC If symptoms persist or with new concerns

## 2021-02-09 NOTE — Progress Notes (Signed)
Name: Kelly Murray   MRN: [id number]    DOB: 1960/11/10   Date:02/10/2021       Progress Note  Subjective  Chief Complaint  Annual Exam  HPI  Patient presents for annual CPE.  Diet: drinks a lot of water, avoids red meat, likes greens, eats fruit  Exercise: she had COVID-19 but started going back to the gym last week,    [place] Office Visit from 02/10/2021 in [facility]  AUDIT-C Score 0      Depression: Phq 9 is  negative Depression screen [facility] 2/9 02/10/2021 05/16/2019 09/16/2016  Decreased Interest 0 0 0  Down, Depressed, Hopeless 1 0 0  PHQ - 2 Score 1 0 0  Altered sleeping 0 0 -  Tired, decreased energy 0 0 -  Change in appetite 0 0 -  Feeling bad or failure about yourself  0 0 -  Trouble concentrating 1 0 -  Moving slowly or fidgety/restless 0 0 -  Suicidal thoughts 0 0 -  PHQ-9 Score 2 0 -  Difficult doing work/chores Not difficult at all - -   Hypertension: BP Readings from Last 3 Encounters:  02/10/21 112/76  01/21/21 110/82  09/22/20 110/72   Obesity: Wt Readings from Last 3 Encounters:  02/10/21 183 lb 14.4 oz (83.4 kg)  05/16/19 191 lb 1.6 oz (86.7 kg)  07/28/17 176 lb (79.8 kg)   BMI Readings from Last 3 Encounters:  02/10/21 28.80 kg/m  05/16/19 30.38 kg/m  07/28/17 27.57 kg/m     Vaccines:   Shingrix: up to date  COVID-19: not interested on booster  Pneumonia: educated and discussed with patient. Flu: up to date   Hep C Screening: 05/07/19 STD testing and prevention (HIV/chl/gon/syphilis): 05/07/19 Intimate partner violence: negative Sexual History : not currently, same female partner for 11 years  Menstrual History/LMP/Abnormal Bleeding: s/p hysterectomy for fibroids and bleeding  Incontinence Symptoms: no problems.   Breast cancer:  - Last Mammogram: 11/05/20 - BRCA gene screening: N/A  Osteoporosis prevention : Discussed high calcium and vitamin D supplementation, weight bearing exercises  Cervical cancer  screening: N/A  Skin cancer: Discussed monitoring for atypical lesions  Colorectal cancer: 03/25/15   Lung cancer: Low Dose CT Chest recommended if Age 72-80 years, 20 pack-year currently smoking OR have quit w/in 15years. Patient does not qualify.   ECG: 09/16/16  Advanced Care Planning: A voluntary discussion about advance care planning including the explanation and discussion of advance directives.  Discussed health care proxy and Living will, and the patient was able to identify a health care proxy as [place] Speck.  Patient does not have a living will at present time. If patient does have living will, I have requested they bring this to the clinic to be scanned in to their chart.  Lipids: Lab Results  Component Value Date   CHOL 250 (H) 05/17/2019   CHOL 236 (H) 09/22/2016   CHOL 215 (H) 02/08/2012   Lab Results  Component Value Date   HDL 73 05/17/2019   HDL 87 09/22/2016   HDL 58 02/08/2012   Lab Results  Component Value Date   LDLCALC 166 (H) 05/17/2019   LDLCALC 136 (H) 09/22/2016   LDLCALC 138 (H) 02/08/2012   Lab Results  Component Value Date   TRIG 69 05/17/2019   TRIG 65 09/22/2016   TRIG 93 02/08/2012   Lab Results  Component Value Date   CHOLHDL 2.7 09/22/2016   No results found for: LDLDIRECT  Glucose: Glucose  Date Value Ref Range Status  05/17/2019 98 65 - 99 mg/dL Final  09/22/2016 92 65 - 99 mg/dL Final  02/27/2014 90 65 - 99 mg/dL Final  12/26/2013 77 65 - 99 mg/dL Final  08/29/2013 103 (H) 65 - 99 mg/dL Final   Glucose, Bld  Date Value Ref Range Status  04/01/2016 92 65 - 99 mg/dL Final  09/29/2015 102 (H) 65 - 99 mg/dL Final  08/29/2014 99 65 - 99 mg/dL Final    Patient Active Problem List   Diagnosis Date Noted   History of kidney cancer 05/16/2019   Osteopenia 11/15/2016   History of breast cancer in adulthood 09/16/2016   History of total hysterectomy 09/16/2016    Past Surgical History:  Procedure Laterality Date   BREAST BIOPSY  Right 2010   + radiation   BREAST BIOPSY Right 2015   2 areas- stereo, neg   BREAST LUMPECTOMY Right 2010   COLONOSCOPY W/ POLYPECTOMY  08/13/2005   COLONOSCOPY WITH PROPOFOL N/A 03/24/2015   Procedure: COLONOSCOPY WITH PROPOFOL;  Surgeon: Lollie Sails, MD;  [facility] ENDOSCOPY;  Service: Endoscopy;  Laterality: N/A;   NEPHRECTOMY Right     Family History  Problem Relation Age of Onset   Cancer Father    Alcoholism Father    Breast cancer Paternal Aunt 70   Breast cancer Paternal Aunt 31    Social History   Socioeconomic History   Marital status: Single    Spouse name: Not on file   Number of children: Not on file   Years of education: Not on file   Highest education level: Not on file  Occupational History   Not on file  Tobacco Use   Smoking status: Never   Smokeless tobacco: Never  Substance and Sexual Activity   Alcohol use: No   Drug use: No   Sexual activity: Not on file  Other Topics Concern   Not on file  Social History Narrative   Not on file   Social Determinants of Health   Financial Resource Strain: Low Risk    Difficulty of Paying Living Expenses: Not hard at all  Food Insecurity: No Food Insecurity   Worried About Running Out of Food in the Last Year: Never true   [place] in the Last Year: Never true  Transportation Needs: No Transportation Needs   Lack of Transportation (Medical): No   Lack of Transportation (Non-Medical): No  Physical Activity: Sufficiently Active   Days of Exercise per Week: 4 days   Minutes of Exercise per Session: 40 min  Stress: No Stress Concern Present   Feeling of Stress : Not at all  Social Connections: Moderately Isolated   Frequency of Communication with Friends and Family: More than three times a week   Frequency of Social Gatherings with Friends and Family: Once a week   Attends Religious Services: Never   Marine scientist or Organizations: No   Attends Music therapist: Not on  file   Marital Status: Living with partner  Intimate Partner Violence: Not At Risk   Fear of Current or Ex-Partner: No   Emotionally Abused: No   Physically Abused: No   Sexually Abused: No     Current Outpatient Medications:    albuterol (VENTOLIN HFA) 108 (90 Base) MCG/ACT inhaler, Inhale 2 puffs into the lungs every 4 (four) hours as needed for wheezing or shortness of breath., Disp: 8 g, Rfl: 0   Cholecalciferol (  VITAMIN D) 50 MCG (2000 UT) CAPS, Take 1 capsule (2,000 Units total) by mouth daily., Disp: 30 capsule, Rfl: 0   cetirizine (ZYRTEC) 10 MG chewable tablet, Chew 10 mg by mouth daily. (Patient not taking: Reported on 02/10/2021), Disp: , Rfl:   Allergies  Allergen Reactions   Iodine    Shellfish Allergy Nausea And Vomiting     ROS  Constitutional: Negative for fever or weight change.  Respiratory: Negative for cough and shortness of breath.   Cardiovascular: Negative for chest pain or palpitations.  Gastrointestinal: Negative for abdominal pain, no bowel changes.  Musculoskeletal: Negative for gait problem or joint swelling.  Skin: Negative for rash.  Neurological: Negative for dizziness or headache.  No other specific complaints in a complete review of systems (except as listed in HPI above).   Objective  Vitals:   02/10/21 1520  BP: 112/76  Pulse: 88  Resp: 16  Temp: 98.2 F (36.8 C)  TempSrc: Oral  SpO2: 98%  Weight: 183 lb 14.4 oz (83.4 kg)  Height: 5' 7"  (1.702 m)    Body mass index is 28.8 kg/m.  Physical Exam  Constitutional: Patient appears well-developed and well-nourished. No distress.  HENT: Head: Normocephalic and atraumatic. Ears: B TMs ok, no erythema or effusion; Nose: Not done Mouth/Throat:not done  Eyes: Conjunctivae and EOM are normal. Pupils are equal, round, and reactive to light. No scleral icterus.  Neck: Normal range of motion. Neck supple. No JVD present. No thyromegaly present.  Cardiovascular: Normal rate, regular rhythm  and normal heart sounds.  No murmur heard. No BLE edema. Pulmonary/Chest: Effort normal and breath sounds normal. No respiratory distress. Abdominal: Soft. Bowel sounds are normal, no distension. There is no tenderness. no masses Breast:scaring from right breast lumpectomy, no new masses or nipple discharge  FEMALE GENITALIA:  Not done  RECTAL: not done  Musculoskeletal: Normal range of motion, no joint effusions. No gross deformities Neurological: he is alert and oriented to person, place, and time. No cranial nerve deficit. Coordination, balance, strength, speech and gait are normal.  Skin: Skin is warm and dry. No rash noted. No erythema.  Psychiatric: Patient has a normal mood and affect. behavior is normal. Judgment and thought content normal.   Fall Risk: Fall Risk  02/10/2021 05/16/2019 09/16/2016  Falls in the past year? 0 0 No  Number falls in past yr: - 0 -  Injury with Fall? - 0 -  Follow up Falls prevention discussed - -     Assessment & Plan  1. Well adult exam  - Ambulatory referral to Gastroenterology - Lipid panel - CBC with Differential/Platelet - Microalbumin / creatinine urine ratio - Hemoglobin A1c - Comprehensive metabolic panel  2. History of cancer of right breast   3. History of kidney cancer   4. History of right nephrectomy  - CBC with Differential/Platelet - Microalbumin / creatinine urine ratio - Comprehensive metabolic panel  5. Dyslipidemia  - Lipid panel  6. Diabetes mellitus screening  - Hemoglobin A1c   7. Osteopenia after menopause  - DG Bone Density; Future  8. Breast cancer screening by mammogram  - MM 3D SCREEN BREAST BILATERAL; Future   -USPSTF grade A and B recommendations reviewed with patient; age-appropriate recommendations, preventive care, screening tests, etc discussed and encouraged; healthy living encouraged; see AVS for patient education given to patient -Discussed importance of 150 minutes of physical activity  weekly, eat two servings of fish weekly, eat one serving of tree nuts ( cashews, pistachios,  pecans, almonds.Marland Kitchen) every other day, eat 6 servings of fruit/vegetables daily and drink plenty of water and avoid sweet beverages.

## 2021-02-10 ENCOUNTER — Ambulatory Visit (INDEPENDENT_AMBULATORY_CARE_PROVIDER_SITE_OTHER): Payer: BC Managed Care – PPO | Admitting: Family Medicine

## 2021-02-10 ENCOUNTER — Encounter: Payer: Self-pay | Admitting: Family Medicine

## 2021-02-10 VITALS — BP 112/76 | HR 88 | Temp 98.2°F | Resp 16 | Ht 67.0 in | Wt 183.9 lb

## 2021-02-10 DIAGNOSIS — Z0000 Encounter for general adult medical examination without abnormal findings: Secondary | ICD-10-CM

## 2021-02-10 DIAGNOSIS — Z85528 Personal history of other malignant neoplasm of kidney: Secondary | ICD-10-CM | POA: Diagnosis not present

## 2021-02-10 DIAGNOSIS — Z131 Encounter for screening for diabetes mellitus: Secondary | ICD-10-CM

## 2021-02-10 DIAGNOSIS — E785 Hyperlipidemia, unspecified: Secondary | ICD-10-CM

## 2021-02-10 DIAGNOSIS — M8580 Other specified disorders of bone density and structure, unspecified site: Secondary | ICD-10-CM

## 2021-02-10 DIAGNOSIS — Z905 Acquired absence of kidney: Secondary | ICD-10-CM | POA: Diagnosis not present

## 2021-02-10 DIAGNOSIS — Z853 Personal history of malignant neoplasm of breast: Secondary | ICD-10-CM

## 2021-02-10 DIAGNOSIS — Z780 Asymptomatic menopausal state: Secondary | ICD-10-CM

## 2021-02-10 DIAGNOSIS — Z1231 Encounter for screening mammogram for malignant neoplasm of breast: Secondary | ICD-10-CM

## 2021-02-11 ENCOUNTER — Telehealth: Payer: Self-pay

## 2021-02-11 NOTE — Telephone Encounter (Signed)
CALLED PATIENT NO ANSWER LEFT VOICEMAIL FOR A CALL BACK ? ?

## 2021-02-12 ENCOUNTER — Other Ambulatory Visit: Payer: Self-pay

## 2021-02-12 ENCOUNTER — Other Ambulatory Visit: Payer: BC Managed Care – PPO

## 2021-02-12 DIAGNOSIS — E785 Hyperlipidemia, unspecified: Secondary | ICD-10-CM

## 2021-02-12 DIAGNOSIS — Z905 Acquired absence of kidney: Secondary | ICD-10-CM

## 2021-02-12 DIAGNOSIS — Z0000 Encounter for general adult medical examination without abnormal findings: Secondary | ICD-10-CM

## 2021-02-12 DIAGNOSIS — Z131 Encounter for screening for diabetes mellitus: Secondary | ICD-10-CM

## 2021-02-13 LAB — MICROALBUMIN / CREATININE URINE RATIO
Creatinine, Urine: 76 mg/dL
Microalb/Creat Ratio: <4 mg/g creat (ref 0–29)
Microalbumin, Urine: <3 ug/mL

## 2021-02-14 LAB — COMPREHENSIVE METABOLIC PANEL
ALT: 28 IU/L (ref 0–32)
AST: 31 IU/L (ref 0–40)
Albumin/Globulin Ratio: 1.4 (ref 1.2–2.2)
Albumin: 4.1 g/dL (ref 3.8–4.9)
Alkaline Phosphatase: 110 IU/L (ref 44–121)
BUN/Creatinine Ratio: 16 (ref 9–23)
BUN: 19 mg/dL (ref 6–24)
Bilirubin Total: 0.3 mg/dL (ref 0.0–1.2)
CO2: 26 mmol/L (ref 20–29)
Calcium: 9.7 mg/dL (ref 8.7–10.2)
Chloride: 101 mmol/L (ref 96–106)
Creatinine, Ser: 1.19 mg/dL — ABNORMAL HIGH (ref 0.57–1.00)
Globulin, Total: 3 g/dL (ref 1.5–4.5)
Glucose: 86 mg/dL (ref 70–99)
Potassium: 4.4 mmol/L (ref 3.5–5.2)
Sodium: 139 mmol/L (ref 134–144)
Total Protein: 7.1 g/dL (ref 6.0–8.5)
eGFR: 53 mL/min/{1.73_m2} — ABNORMAL LOW (ref >59)

## 2021-02-14 LAB — CBC WITH DIFFERENTIAL/PLATELET
Basophils Absolute: 0.1 10*3/uL (ref 0.0–0.2)
Basos: 1 %
EOS (ABSOLUTE): 0.1 10*3/uL (ref 0.0–0.4)
Eos: 2 %
Hematocrit: 38.5 % (ref 34.0–46.6)
Hemoglobin: 13 g/dL (ref 11.1–15.9)
Immature Grans (Abs): 0 10*3/uL (ref 0.0–0.1)
Immature Granulocytes: 0 %
Lymphocytes Absolute: 2 10*3/uL (ref 0.7–3.1)
Lymphs: 41 %
MCH: 31.3 pg (ref 26.6–33.0)
MCHC: 33.8 g/dL (ref 31.5–35.7)
MCV: 93 fL (ref 79–97)
Monocytes Absolute: 0.5 10*3/uL (ref 0.1–0.9)
Monocytes: 10 %
Neutrophils Absolute: 2.2 10*3/uL (ref 1.4–7.0)
Neutrophils: 46 %
Platelets: 234 10*3/uL (ref 150–450)
RBC: 4.15 x10E6/uL (ref 3.77–5.28)
RDW: 12.4 % (ref 11.7–15.4)
WBC: 4.8 10*3/uL (ref 3.4–10.8)

## 2021-02-14 LAB — LIPID PANEL
Chol/HDL Ratio: 4 ratio (ref 0.0–4.4)
Cholesterol, Total: 281 mg/dL — ABNORMAL HIGH (ref 100–199)
HDL: 71 mg/dL (ref >39)
LDL Chol Calc (NIH): 200 mg/dL — ABNORMAL HIGH (ref 0–99)
Triglycerides: 66 mg/dL (ref 0–149)
VLDL Cholesterol Cal: 10 mg/dL (ref 5–40)

## 2021-02-14 LAB — HGB A1C W/O EAG: Hgb A1c MFr Bld: 5.6 % (ref 4.8–5.6)

## 2021-11-19 ENCOUNTER — Ambulatory Visit
Admission: RE | Admit: 2021-11-19 | Discharge: 2021-11-19 | Disposition: A | Discharge status: Home / Self Care | Payer: BC Managed Care – PPO | Source: Ambulatory Visit | Attending: Family Medicine | Admitting: Family Medicine

## 2021-11-19 DIAGNOSIS — Z1231 Encounter for screening mammogram for malignant neoplasm of breast: Secondary | ICD-10-CM | POA: Diagnosis not present

## 2021-11-19 DIAGNOSIS — Z780 Asymptomatic menopausal state: Secondary | ICD-10-CM | POA: Insufficient documentation

## 2021-11-19 DIAGNOSIS — M8580 Other specified disorders of bone density and structure, unspecified site: Secondary | ICD-10-CM

## 2021-11-19 DIAGNOSIS — M8589 Other specified disorders of bone density and structure, multiple sites: Secondary | ICD-10-CM | POA: Diagnosis not present

## 2021-12-16 ENCOUNTER — Encounter: Payer: Self-pay | Admitting: Family Medicine

## 2021-12-16 ENCOUNTER — Ambulatory Visit: Payer: BC Managed Care – PPO | Admitting: Family Medicine

## 2021-12-16 VITALS — BP 126/80 | HR 98 | Temp 98.8°F | Resp 16 | Ht 67.0 in | Wt 180.5 lb

## 2021-12-16 DIAGNOSIS — B349 Viral infection, unspecified: Secondary | ICD-10-CM | POA: Diagnosis not present

## 2021-12-16 MED ORDER — BENZONATATE 100 MG PO CAPS: 100.0000 mg | ORAL_CAPSULE | Freq: Two times a day (BID) | ORAL | 0 refills | Status: DC | PRN

## 2021-12-16 NOTE — Progress Notes (Signed)
Name: Kelly Murray   MRN: [id number]    DOB: [birth year]   Date:12/16/2021       Progress Note  Subjective  Chief Complaint  Head/Chest Congestion  HPI  Viral illness: she developed nausea, chills, fever, vomiting 6 days ago , now she has head congestion, headache, rhinorrhea and now has a productive cough two days ago.   She did not get a COVID-19 test, she went back to work yesterday and felt very tired and weak at the end of the day  She has noticed lack of appetite.    Patient Active Problem List   Diagnosis Date Noted   History of kidney cancer 05/16/2019   Osteopenia 11/15/2016   History of breast cancer in adulthood 09/16/2016   History of total hysterectomy 09/16/2016    Past Surgical History:  Procedure Laterality Date   BREAST BIOPSY Right 2010   + radiation   BREAST BIOPSY Right 2015   2 areas- stereo, neg   BREAST LUMPECTOMY Right 2010   COLONOSCOPY W/ POLYPECTOMY  08/13/2005   COLONOSCOPY WITH PROPOFOL N/A 03/24/2015   Procedure: COLONOSCOPY WITH PROPOFOL;  Surgeon: Lollie Sails, MD;  Location: [facility] ENDOSCOPY;  Service: Endoscopy;  Laterality: N/A;   NEPHRECTOMY Right     Family History  Problem Relation Age of Onset   Cancer Father    Alcoholism Father    Breast cancer Paternal Aunt 67   Breast cancer Paternal Aunt 66    Social History   Tobacco Use   Smoking status: Never   Smokeless tobacco: Never  Substance Use Topics   Alcohol use: No     Current Outpatient Medications:    Cholecalciferol (VITAMIN D) 50 MCG (2000 UT) CAPS, Take 1 capsule (2,000 Units total) by mouth daily., Disp: 30 capsule, Rfl: 0   cetirizine (ZYRTEC) 10 MG chewable tablet, Chew 10 mg by mouth daily. (Patient not taking: Reported on 02/10/2021), Disp: , Rfl:   Allergies  Allergen Reactions   Iodine    Shellfish Allergy Nausea And Vomiting    I personally reviewed active problem list, medication list, allergies, family history, social history, health maintenance  with the patient/caregiver today.   ROS  Ten systems reviewed and is negative except as mentioned in HPI   Objective  Vitals:   12/16/21 1514  BP: 126/80  Pulse: 98  Resp: 16  Temp: 98.8 F (37.1 C)  TempSrc: Oral  SpO2: 100%  Weight: 180 lb 8 oz (81.9 kg)  Height: '5\' 7"'$  (1.702 m)    Body mass index is 28.27 kg/m.  Physical Exam  Constitutional: Patient appears well-developed and well-nourished. No distress.  HEENT: head atraumatic, normocephalic, pupils equal and reactive to light, neck supple, throat within normal limits, clear rhinorrhea, sounds nasally  Cardiovascular: Normal rate, regular rhythm and normal heart sounds.  No murmur heard. No BLE edema. Pulmonary/Chest: Effort normal and breath sounds normal. No respiratory distress. Abdominal: Soft.  There is no tenderness. Psychiatric: Patient has a normal mood and affect. behavior is normal. Judgment and thought content normal.   PHQ2/9:    12/16/2021    3:15 PM 02/10/2021    3:37 PM 05/16/2019    1:40 PM 09/16/2016   10:08 AM  Depression screen PHQ 2/9  Decreased Interest 0 0 0 0  Down, Depressed, Hopeless 0 1 0 0  PHQ - 2 Score 0 1 0 0  Altered sleeping 0 0 0   Tired, decreased energy 0 0 0   Change in  appetite 0 0 0   Feeling bad or failure about yourself  0 0 0   Trouble concentrating 0 1 0   Moving slowly or fidgety/restless 0 0 0   Suicidal thoughts 0 0 0   PHQ-9 Score 0 2 0   Difficult doing work/chores  Not difficult at all      phq 9 is negative   Fall Risk:    12/16/2021    3:15 PM 02/10/2021    3:28 PM 05/16/2019    1:40 PM 09/16/2016   10:08 AM  Fall Risk   Falls in the past year? 0 0 0 No  Number falls in past yr:   0   Injury with Fall?   0   Risk for fall due to : No Fall Risks     Follow up Education provided;Falls evaluation completed;Falls prevention discussed Falls prevention discussed      Functional Status Survey: Is the patient deaf or have difficulty hearing?: No Does  the patient have difficulty seeing, even when wearing glasses/contacts?: No Does the patient have difficulty concentrating, remembering, or making decisions?: No Does the patient have difficulty walking or climbing stairs?: No Does the patient have difficulty dressing or bathing?: No Does the patient have difficulty doing errands alone such as visiting a doctor's office or shopping?: No    Assessment & Plan  1. Viral illness  - benzonatate (TESSALON) 100 MG capsule; Take 1 capsule (100 mg total) by mouth 2 (two) times daily as needed for cough.  Dispense: 40 capsule; Refill: 0   Discussed fluids, rest, time off, mucinex dm , otc cold medication, saline spray, call back if no improvement of symptoms or cough gets worse and we can send in [place] for productive cough or order a CXR if needed

## 2022-02-15 ENCOUNTER — Encounter: Payer: BC Managed Care – PPO | Admitting: Family Medicine

## 2022-11-08 ENCOUNTER — Other Ambulatory Visit: Payer: Self-pay | Admitting: Family Medicine

## 2022-11-08 ENCOUNTER — Encounter: Payer: Self-pay | Admitting: Family Medicine

## 2022-11-08 DIAGNOSIS — Z1231 Encounter for screening mammogram for malignant neoplasm of breast: Secondary | ICD-10-CM

## 2022-11-24 ENCOUNTER — Ambulatory Visit
Admission: RE | Admit: 2022-11-24 | Discharge: 2022-11-24 | Disposition: A | Discharge status: Home / Self Care | Payer: BC Managed Care – PPO | Source: Ambulatory Visit | Attending: Family Medicine | Admitting: Family Medicine

## 2022-11-24 DIAGNOSIS — Z1231 Encounter for screening mammogram for malignant neoplasm of breast: Secondary | ICD-10-CM | POA: Diagnosis present

## 2023-06-03 ENCOUNTER — Encounter: Payer: Self-pay | Admitting: Family Medicine

## 2023-06-03 ENCOUNTER — Ambulatory Visit (INDEPENDENT_AMBULATORY_CARE_PROVIDER_SITE_OTHER): Payer: Self-pay | Admitting: Family Medicine

## 2023-06-03 VITALS — BP 120/82 | HR 80 | Resp 16 | Ht 67.0 in | Wt 185.1 lb

## 2023-06-03 DIAGNOSIS — Z85528 Personal history of other malignant neoplasm of kidney: Secondary | ICD-10-CM

## 2023-06-03 DIAGNOSIS — Z905 Acquired absence of kidney: Secondary | ICD-10-CM

## 2023-06-03 DIAGNOSIS — Z780 Asymptomatic menopausal state: Secondary | ICD-10-CM

## 2023-06-03 DIAGNOSIS — Z01419 Encounter for gynecological examination (general) (routine) without abnormal findings: Secondary | ICD-10-CM

## 2023-06-03 DIAGNOSIS — Z853 Personal history of malignant neoplasm of breast: Secondary | ICD-10-CM

## 2023-06-03 DIAGNOSIS — Z131 Encounter for screening for diabetes mellitus: Secondary | ICD-10-CM

## 2023-06-03 DIAGNOSIS — E785 Hyperlipidemia, unspecified: Secondary | ICD-10-CM

## 2023-06-03 DIAGNOSIS — M8580 Other specified disorders of bone density and structure, unspecified site: Secondary | ICD-10-CM

## 2023-06-03 NOTE — Progress Notes (Signed)
 Name: Kelly Murray   MRN: [id number]    DOB: Oct 08, 1960   Date:06/03/2023       Progress Note  Subjective  Chief Complaint  Chief Complaint  Patient presents with   Annual Exam    HPI  Patient presents for annual CPE.  Diet: cutting down on sweets and junk and processed food  Exercise: she has been working out 4 days a week 30 minutes , but walks a lot at work   Last [employer] Exam: completed Last Dental Exam: completed  Flowsheet Row Office Visit from 06/03/2023 in [facility]  AUDIT-C Score 1      Depression: Phq 9 is  negative    06/03/2023    8:42 AM 12/16/2021    3:15 PM 02/10/2021    3:37 PM 05/16/2019    1:40 PM 09/16/2016   10:08 AM  Depression screen PHQ 2/9  Decreased Interest 0 0 0 0 0  Down, Depressed, Hopeless 0 0 1 0 0  PHQ - 2 Score 0 0 1 0 0  Altered sleeping 0 0 0 0   Tired, decreased energy 0 0 0 0   Change in appetite 0 0 0 0   Feeling bad or failure about yourself  0 0 0 0   Trouble concentrating 0 0 1 0   Moving slowly or fidgety/restless 0 0 0 0   Suicidal thoughts 0 0 0 0   PHQ-9 Score 0 0 2 0   Difficult doing work/chores Not difficult at all  Not difficult at all     Hypertension: BP Readings from Last 3 Encounters:  06/03/23 120/82  12/16/21 126/80  02/10/21 112/76   Obesity: Wt Readings from Last 3 Encounters:  06/03/23 185 lb 1.6 oz (84 kg)  12/16/21 180 lb 8 oz (81.9 kg)  02/10/21 183 lb 14.4 oz (83.4 kg)   BMI Readings from Last 3 Encounters:  06/03/23 28.99 kg/m  12/16/21 28.27 kg/m  02/10/21 28.80 kg/m     Vaccines: reviewed with the patient. She refuses PCV 20  Hep C Screening: completed STD testing and prevention (HIV/chl/gon/syphilis): N/A Intimate partner violence: negative screen  Sexual History :same partner past 15 years  Menstrual History/LMP/Abnormal Bleeding: hysterectomy  Discussed importance of follow up if any post-menopausal bleeding: yes  Incontinence Symptoms: negative for  symptoms   Breast cancer:  - Last Mammogram: up to date  - BRCA gene screening: under the care of oncologist - negative for Linch syndrome  Osteoporosis Prevention : Discussed high calcium and vitamin D  supplementation, weight bearing exercises Bone density :yes - she wants to hold off on getting it rechecked  Cervical cancer screening: not applicable due to hysterectomy  Skin cancer: Discussed monitoring for atypical lesions  Colorectal cancer: up to date   Lung cancer:  Low Dose CT Chest recommended if Age 71-80 years, 20 pack-year currently smoking OR have quit w/in 15years. Patient does not qualify for screen   ECG: we will check during her next follow up visit   Advanced Care Planning: A voluntary discussion about advance care planning including the explanation and discussion of advance directives.  Discussed health care proxy and Living will, and the patient was able to identify a health care proxy as Lynda Sands.  Patient does have a living will and power of attorney of health care   Patient Active Problem List   Diagnosis Date Noted   History of kidney cancer 05/16/2019   Osteopenia 11/15/2016   History  of breast cancer in adulthood 09/16/2016   History of total hysterectomy 09/16/2016    Past Surgical History:  Procedure Laterality Date   BREAST BIOPSY Right 2010   + radiation   BREAST BIOPSY Right 2015   2 areas- stereo, neg   BREAST LUMPECTOMY Right 2010   COLONOSCOPY W/ POLYPECTOMY  08/13/2005   COLONOSCOPY WITH PROPOFOL  N/A 03/24/2015   Procedure: COLONOSCOPY WITH PROPOFOL ;  Surgeon: Deveron Fly, MD;  Location: [facility] ENDOSCOPY;  Service: Endoscopy;  Laterality: N/A;   NEPHRECTOMY Right     Family History  Problem Relation Age of Onset   Cancer Father    Alcoholism Father    Breast cancer Paternal Aunt 31   Breast cancer Paternal Aunt 88    Social History   Socioeconomic History   Marital status: Single    Spouse name: Not on file   Number of  children: Not on file   Years of education: Not on file   Highest education level: Not on file  Occupational History   Not on file  Tobacco Use   Smoking status: Never   Smokeless tobacco: Never  Vaping Use   Vaping status: Not on file  Substance and Sexual Activity   Alcohol use: Yes    Comment: socially   Drug use: No   Sexual activity: Yes  Other Topics Concern   Not on file  Social History Narrative   Not on file   Social Drivers of Health   Financial Resource Strain: Low Risk  (06/03/2023)   Overall Financial Resource Strain (CARDIA)    Difficulty of Paying Living Expenses: Not hard at all  Food Insecurity: No Food Insecurity (06/03/2023)   Hunger Vital Sign    Worried About Running Out of Food in the Last Year: Never true    Ran Out of Food in the Last Year: Never true  Transportation Needs: No Transportation Needs (06/03/2023)   PRAPARE - Administrator, Civil Service (Medical): No    Lack of Transportation (Non-Medical): No  Physical Activity: Insufficiently Active (06/03/2023)   Exercise Vital Sign    Days of Exercise per Week: 4 days    Minutes of Exercise per Session: 30 min  Stress: No Stress Concern Present (06/03/2023)   [employer] of Occupational Health - Occupational Stress Questionnaire    Feeling of Stress : Not at all  Social Connections: Socially Isolated (06/03/2023)   Social Connection and Isolation Panel [NHANES]    Frequency of Communication with Friends and Family: More than three times a week    Frequency of Social Gatherings with Friends and Family: More than three times a week    Attends Religious Services: Never    Database administrator or Organizations: No    Attends Banker Meetings: Never    Marital Status: Never married  Intimate Partner Violence: Not At Risk (06/03/2023)   Humiliation, Afraid, Rape, and Kick questionnaire    Fear of Current or Ex-Partner: No    Emotionally Abused: No    Physically Abused: No     Sexually Abused: No     Current Outpatient Medications:    cetirizine (ZYRTEC) 10 MG chewable tablet, Chew 10 mg by mouth daily., Disp: , Rfl:    benzonatate  (TESSALON ) 100 MG capsule, Take 1 capsule (100 mg total) by mouth 2 (two) times daily as needed for cough. (Patient not taking: Reported on 06/03/2023), Disp: 40 capsule, Rfl: 0   Cholecalciferol (VITAMIN D ) 50 MCG (  2000 UT) CAPS, Take 1 capsule (2,000 Units total) by mouth daily. (Patient not taking: Reported on 06/03/2023), Disp: 30 capsule, Rfl: 0  Allergies  Allergen Reactions   Iodine    Shellfish Allergy Nausea And Vomiting     ROS  Constitutional: Negative for fever or weight change.  Respiratory: Negative for cough and shortness of breath.   Cardiovascular: Negative for chest pain or palpitations.  Gastrointestinal: Negative for abdominal pain, no bowel changes.  Musculoskeletal: Negative for gait problem or joint swelling.  Skin: Negative for rash.  Neurological: Negative for dizziness or headache.  No other specific complaints in a complete review of systems (except as listed in HPI above).   Objective  Vitals:   06/03/23 0847  BP: 120/82  Pulse: 80  Resp: 16  SpO2: 100%  Weight: 185 lb 1.6 oz (84 kg)  Height: 5\' 7"  (1.702 m)    Body mass index is 28.99 kg/m.  Physical Exam  Constitutional: Patient appears well-developed and well-nourished. No distress.  HENT: Head: Normocephalic and atraumatic. Ears: B TMs ok, no erythema or effusion; Nose: Nose normal. Mouth/Throat: Oropharynx is clear and moist. No oropharyngeal exudate.  Eyes: Conjunctivae and EOM are normal. Pupils are equal, round, and reactive to light. No scleral icterus.  Neck: Normal range of motion. Neck supple. No JVD present. No thyromegaly present.  Cardiovascular: Normal rate, regular rhythm and normal heart sounds.  No murmur heard. No BLE edema. Pulmonary/Chest: Effort normal and breath sounds normal. No respiratory distress. Abdominal:  Soft. Bowel sounds are normal, no distension.  Large abdominal scar There is no tenderness. no masses Breast: scar tissue and scars on right outer lower quadrant right breast, fibrous left breast, per patient stable  FEMALE GENITALIA:  Not done  RECTAL: not done  Musculoskeletal: Normal range of motion, no joint effusions. No gross deformities Neurological: he is alert and oriented to person, place, and time. No cranial nerve deficit. Coordination, balance, strength, speech and gait are normal.  Skin: Skin is warm and dry. No rash noted. No erythema.  Psychiatric: Patient has a normal mood and affect. behavior is normal. Judgment and thought content normal.     Assessment & Plan  1. Well woman exam (Primary)  - Lipoprotein Fractionation, NMR with Lipid Panel (Triglycerides/HDL-C) - Microalbumin / creatinine urine ratio - CBC with Differential/Platelet - Comprehensive metabolic panel with GFR - Hemoglobin A1c - VITAMIN D  25 Hydroxy (Vit-D Deficiency, Fractures)  2. Dyslipidemia  - Lipoprotein Fractionation, NMR with Lipid Panel (Triglycerides/HDL-C)  3. History of kidney cancer  - Microalbumin / creatinine urine ratio - CBC with Differential/Platelet - Comprehensive metabolic panel with GFR - VITAMIN D  25 Hydroxy (Vit-D Deficiency, Fractures)  4. History of right nephrectomy  - Microalbumin / creatinine urine ratio - CBC with Differential/Platelet - Comprehensive metabolic panel with GFR - VITAMIN D  25 Hydroxy (Vit-D Deficiency, Fractures)  5. History of breast cancer in adulthood   6. Diabetes mellitus screening  - Hemoglobin A1c  7. Osteopenia after menopause  - VITAMIN D  25 Hydroxy (Vit-D Deficiency, Fractures)   -USPSTF grade A and B recommendations reviewed with patient; age-appropriate recommendations, preventive care, screening tests, etc discussed and encouraged; healthy living encouraged; see AVS for patient education given to patient -Discussed importance  of 150 minutes of physical activity weekly, eat two servings of fish weekly, eat one serving of tree nuts ( cashews, pistachios, pecans, almonds.Aaron Aas) every other day, eat 6 servings of fruit/vegetables daily and drink plenty of water and avoid sweet  beverages.   -Reviewed Health Maintenance: Yes.

## 2023-06-10 LAB — CBC WITH DIFFERENTIAL/PLATELET
Absolute Lymphocytes: 1348 {cells}/uL (ref 850–3900)
Absolute Monocytes: 349 {cells}/uL (ref 200–950)
Basophils Absolute: 38 {cells}/uL (ref 0–200)
Basophils Relative: 0.9 %
Eosinophils Absolute: 101 {cells}/uL (ref 15–500)
Eosinophils Relative: 2.4 %
HCT: 41.1 % (ref 35.0–45.0)
Hemoglobin: 14 g/dL (ref 11.7–15.5)
MCH: 32 pg (ref 27.0–33.0)
MCHC: 34.1 g/dL (ref 32.0–36.0)
MCV: 94.1 fL (ref 80.0–100.0)
MPV: 12 fL (ref 7.5–12.5)
Monocytes Relative: 8.3 %
Neutro Abs: 2365 {cells}/uL (ref 1500–7800)
Neutrophils Relative %: 56.3 %
Platelets: 224 10*3/uL (ref 140–400)
RBC: 4.37 10*6/uL (ref 3.80–5.10)
RDW: 12.3 % (ref 11.0–15.0)
Total Lymphocyte: 32.1 %
WBC: 4.2 10*3/uL (ref 3.8–10.8)

## 2023-06-10 LAB — MICROALBUMIN / CREATININE URINE RATIO
Creatinine, Urine: 23 mg/dL (ref 20–275)
Microalb, Ur: <0.2 mg/dL

## 2023-06-10 LAB — LIPOPROTEIN FRACTIONATION, NMR W/ LIPID PNL
CHOL/HDL C: 4 calc (ref <5.0)
CHOLESTEROL, TOTAL: 270 mg/dL — ABNORMAL HIGH (ref <200)
HDL CHOLESTEROL: 68 mg/dL (ref >49)
HDL P: 37.5 umol/L (ref >32.8)
HDL Size: 9.4 nm (ref >9.0)
LARGE HDL P: 11.6 umol/L (ref >7.2)
LARGE VLDL P: <1.5 nmol/L (ref <3.7)
LDL CHOLESTEROL: 186 mg/dL — ABNORMAL HIGH (ref <100)
LDL P: 2220 nmol/L — ABNORMAL HIGH (ref <935)
LDL SIZE: 21.9 nm (ref >20.5)
NON HDL CHOLESTEROL: 202 mg/dL — ABNORMAL HIGH (ref <130)
SMALL LDL P: 296 nmol/L (ref <467)
TG/HDL C: 0.9 calc (ref <2.0)
TRIGLYCERIDES: 63 mg/dL (ref <150)
VLDL Size: 38.4 nm (ref <47.1)

## 2023-06-10 LAB — COMPREHENSIVE METABOLIC PANEL WITH GFR
AG Ratio: 1.2 (calc) (ref 1.0–2.5)
ALT: 34 U/L — ABNORMAL HIGH (ref 6–29)
AST: 29 U/L (ref 10–35)
Albumin: 4.2 g/dL (ref 3.6–5.1)
Alkaline phosphatase (APISO): 108 U/L (ref 37–153)
BUN: 16 mg/dL (ref 7–25)
CO2: 28 mmol/L (ref 20–32)
Calcium: 9.6 mg/dL (ref 8.6–10.4)
Chloride: 104 mmol/L (ref 98–110)
Creat: 1.04 mg/dL (ref 0.50–1.05)
Globulin: 3.4 g/dL (ref 1.9–3.7)
Glucose, Bld: 89 mg/dL (ref 65–99)
Potassium: 4.2 mmol/L (ref 3.5–5.3)
Sodium: 141 mmol/L (ref 135–146)
Total Bilirubin: 0.5 mg/dL (ref 0.2–1.2)
Total Protein: 7.6 g/dL (ref 6.1–8.1)
eGFR: 62 mL/min/{1.73_m2} (ref >=60)

## 2023-06-10 LAB — HEMOGLOBIN A1C
Hgb A1c MFr Bld: 5.6 % (ref <5.7)
Mean Plasma Glucose: 114 mg/dL
eAG (mmol/L): 6.3 mmol/L

## 2023-06-10 LAB — VITAMIN D 25 HYDROXY (VIT D DEFICIENCY, FRACTURES): Vit D, 25-Hydroxy: 30 ng/mL (ref 30–100)

## 2023-06-13 ENCOUNTER — Telehealth: Payer: Self-pay

## 2023-06-13 ENCOUNTER — Other Ambulatory Visit: Payer: Self-pay

## 2023-06-13 ENCOUNTER — Other Ambulatory Visit: Payer: Self-pay | Admitting: Family Medicine

## 2023-06-13 MED ORDER — ROSUVASTATIN CALCIUM 10 MG PO TABS
10.0000 mg | ORAL_TABLET | Freq: Every day | ORAL | 1 refills | Status: DC
  Filled 2023-06-13: qty 90, 90d supply, fill #0
  Filled 2023-09-06: qty 90, 90d supply, fill #1

## 2023-06-13 NOTE — Telephone Encounter (Signed)
 Copied from CRM [id number]. Topic: Clinical - Lab/Test Results >> Jun 13, 2023  1:36 PM Santiya F wrote: Reason for CRM: Patient is calling in to get her lab results. Results given. Patient wanted to let Dr. Ava Lei know she is willing to try the Rosuvastatin.

## 2023-06-13 NOTE — Telephone Encounter (Signed)
Pt aware.

## 2023-11-07 ENCOUNTER — Other Ambulatory Visit: Payer: Self-pay | Admitting: Family Medicine

## 2023-11-07 DIAGNOSIS — Z1231 Encounter for screening mammogram for malignant neoplasm of breast: Secondary | ICD-10-CM

## 2023-12-05 ENCOUNTER — Encounter: Payer: Self-pay | Admitting: Family Medicine

## 2023-12-05 ENCOUNTER — Other Ambulatory Visit: Payer: Self-pay

## 2023-12-05 ENCOUNTER — Ambulatory Visit: Admitting: Family Medicine

## 2023-12-05 VITALS — BP 134/78 | HR 70 | Resp 16 | Ht 67.0 in | Wt 169.9 lb

## 2023-12-05 DIAGNOSIS — E785 Hyperlipidemia, unspecified: Secondary | ICD-10-CM | POA: Diagnosis not present

## 2023-12-05 DIAGNOSIS — L301 Dyshidrosis [pompholyx]: Secondary | ICD-10-CM

## 2023-12-05 DIAGNOSIS — Z853 Personal history of malignant neoplasm of breast: Secondary | ICD-10-CM

## 2023-12-05 DIAGNOSIS — Z8639 Personal history of other endocrine, nutritional and metabolic disease: Secondary | ICD-10-CM

## 2023-12-05 DIAGNOSIS — L989 Disorder of the skin and subcutaneous tissue, unspecified: Secondary | ICD-10-CM

## 2023-12-05 DIAGNOSIS — Z905 Acquired absence of kidney: Secondary | ICD-10-CM | POA: Diagnosis not present

## 2023-12-05 DIAGNOSIS — M8580 Other specified disorders of bone density and structure, unspecified site: Secondary | ICD-10-CM

## 2023-12-05 DIAGNOSIS — R229 Localized swelling, mass and lump, unspecified: Secondary | ICD-10-CM

## 2023-12-05 DIAGNOSIS — N1831 Chronic kidney disease, stage 3a: Secondary | ICD-10-CM

## 2023-12-05 DIAGNOSIS — Z780 Asymptomatic menopausal state: Secondary | ICD-10-CM

## 2023-12-05 DIAGNOSIS — Z85528 Personal history of other malignant neoplasm of kidney: Secondary | ICD-10-CM

## 2023-12-05 LAB — COMPREHENSIVE METABOLIC PANEL WITH GFR
AG Ratio: 1.3 (calc) (ref 1.0–2.5)
ALT: 151 U/L — ABNORMAL HIGH (ref 6–29)
AST: 139 U/L — ABNORMAL HIGH (ref 10–35)
Albumin: 4.4 g/dL (ref 3.6–5.1)
Alkaline phosphatase (APISO): 226 U/L — ABNORMAL HIGH (ref 37–153)
BUN: 16 mg/dL (ref 7–25)
CO2: 28 mmol/L (ref 20–32)
Calcium: 9.7 mg/dL (ref 8.6–10.4)
Chloride: 103 mmol/L (ref 98–110)
Creat: 1.01 mg/dL (ref 0.50–1.05)
Globulin: 3.4 g/dL (ref 1.9–3.7)
Glucose, Bld: 93 mg/dL (ref 65–99)
Potassium: 4.2 mmol/L (ref 3.5–5.3)
Sodium: 140 mmol/L (ref 135–146)
Total Bilirubin: 0.5 mg/dL (ref 0.2–1.2)
Total Protein: 7.8 g/dL (ref 6.1–8.1)
eGFR: 63 mL/min/1.73m2 (ref >=60)

## 2023-12-05 LAB — LIPID PANEL
Cholesterol: 217 mg/dL — ABNORMAL HIGH (ref <200)
HDL: 87 mg/dL (ref >=50)
LDL Cholesterol (Calc): 112 mg/dL — ABNORMAL HIGH
Non-HDL Cholesterol (Calc): 130 mg/dL — ABNORMAL HIGH (ref <130)
Total CHOL/HDL Ratio: 2.5 (calc) (ref <5.0)
Triglycerides: 82 mg/dL (ref <150)

## 2023-12-05 MED ORDER — LAC-HYDRIN FIVE 5 % EX LOTN
1.0000 | TOPICAL_LOTION | Freq: Two times a day (BID) | CUTANEOUS | 1 refills | Status: DC
  Filled 2023-12-05: qty 226, 113d supply, fill #0

## 2023-12-05 NOTE — Progress Notes (Signed)
 Name: Kelly Murray   MRN: [id number]    DOB: [birth year]   Date:12/05/2023       Progress Note  Subjective  Chief Complaint  Chief Complaint  Patient presents with   Medical Management of Chronic Issues   Discussed the use of AI scribe software for clinical note transcription with the patient, who gave verbal consent to proceed.  History of Present Illness Kelly Murray is a 63 year old female who presents for follow-up on her cholesterol management and kidney function.  She has experienced significant weight loss since her last visit in May, reducing her weight from 185 lbs to 169.9 lbs through lifestyle modifications, including dietary changes and increased physical activity. She exercises five days a week for 45 minutes to an hour, focusing on running, walking, and training. Her diet now excludes sweets, chips, and fried foods, and she focuses on consuming natural, low-calorie foods.  She has a history of breast cancer diagnosed in 2018 and kidney cancer in 2021, with a right nephrectomy performed for the latter. She has a mammogram scheduled for tomorrow. She also had a total hysterectomy in 2015, though no cancer was present there.  Her cholesterol levels were previously high, with a total cholesterol of 270 mg/dL and an LDL particle size of 2220. She is currently taking rosuvastatin  but plans to stop after finishing her remaining six pills. She inquires about using omega-3 supplements instead. No chest pain, palpitations, nausea, vomiting, and any adverse effects from rosuvastatin .  She has chronic kidney disease stage 3, with kidney function fluctuating between the 50s and 60s. She has only one kidney following the right nephrectomy.  She reports osteopenia, with her last bone density check in October 2023.  She mentions developing small, non-tender, raised round lesions on her hands over the past year or two, which have not resolved and have started to spread.  She has a lipoma on  her left posterior shoulder, which she is considering having removed by a general surgeon.    Patient Active Problem List   Diagnosis Date Noted   History of kidney cancer 05/16/2019   Osteopenia 11/15/2016   History of breast cancer in adulthood 09/16/2016   History of total hysterectomy 09/16/2016    Past Surgical History:  Procedure Laterality Date   BREAST BIOPSY Right 2010   + radiation   BREAST BIOPSY Right 2015   2 areas- stereo, neg   BREAST LUMPECTOMY Right 2010   COLONOSCOPY W/ POLYPECTOMY  08/13/2005   COLONOSCOPY WITH PROPOFOL  N/A 03/24/2015   Procedure: COLONOSCOPY WITH PROPOFOL ;  Surgeon: Gladis RAYMOND Mariner, MD;  Location: [facility] ENDOSCOPY;  Service: Endoscopy;  Laterality: N/A;   NEPHRECTOMY Right 04/2013   TOTAL ABDOMINAL HYSTERECTOMY  04/2013    Family History  Problem Relation Age of Onset   Cancer Father    Alcoholism Father    Breast cancer Paternal Aunt 19   Breast cancer Paternal Aunt 32    Social History   Tobacco Use   Smoking status: Never   Smokeless tobacco: Never  Substance Use Topics   Alcohol use: Yes    Comment: socially     Current Outpatient Medications:    cetirizine (ZYRTEC) 10 MG chewable tablet, Chew 10 mg by mouth daily., Disp: , Rfl:    rosuvastatin  (CRESTOR ) 10 MG tablet, Take 1 tablet (10 mg total) by mouth daily., Disp: 90 tablet, Rfl: 1   Cholecalciferol (VITAMIN D ) 50 MCG (2000 UT) CAPS, Take 1 capsule (2,000 Units total)  by mouth daily. (Patient not taking: Reported on 12/05/2023), Disp: 30 capsule, Rfl: 0  Allergies  Allergen Reactions   Iodine    Shellfish Allergy Nausea And Vomiting    I personally reviewed active problem list, medication list, allergies, family history with the patient/caregiver today.   ROS  Ten systems reviewed and is negative except as mentioned in HPI    Objective Physical Exam MEASUREMENTS: Weight- 169.9, BMI- 26.61. CONSTITUTIONAL: Patient appears well-developed and well-nourished. No  distress. HEENT: Head atraumatic, normocephalic, neck supple. CARDIOVASCULAR: Normal rate, regular rhythm and normal heart sounds. No murmur heard. No BLE edema. PULMONARY: Effort normal and breath sounds normal. No respiratory distress. ABDOMINAL: There is no tenderness or distention. MUSCULOSKELETAL: Normal gait. Without gross motor or sensory deficit. Lipoma on left posterior shoulder. PSYCHIATRIC: Patient has a normal mood and affect. Behavior is normal. Judgment and thought content normal. SKIN: Raised round lesions on hands, non-tender.  Vitals:   12/05/23 0822  BP: 134/78  Pulse: 70  Resp: 16  SpO2: 100%  Weight: 169 lb 14.4 oz (77.1 kg)  Height: 5' 7 (1.702 m)    Body mass index is 26.61 kg/m.     PHQ2/9:    12/05/2023    8:18 AM 06/03/2023    8:42 AM 12/16/2021    3:15 PM 02/10/2021    3:37 PM 05/16/2019    1:40 PM  Depression screen PHQ 2/9  Decreased Interest 0 0 0 0 0  Down, Depressed, Hopeless 0 0 0 1 0  PHQ - 2 Score 0 0 0 1 0  Altered sleeping  0 0 0 0  Tired, decreased energy  0 0 0 0  Change in appetite  0 0 0 0  Feeling bad or failure about yourself   0 0 0 0  Trouble concentrating  0 0 1 0  Moving slowly or fidgety/restless  0 0 0 0  Suicidal thoughts  0 0 0 0  PHQ-9 Score  0 0 2 0  Difficult doing work/chores  Not difficult at all  Not difficult at all     phq 9 is negative  Fall Risk:    12/05/2023    8:18 AM 12/16/2021    3:15 PM 02/10/2021    3:28 PM 05/16/2019    1:40 PM 09/16/2016   10:08 AM  Fall Risk   Falls in the past year? 0 0 0 0  No   Number falls in past yr: 0   0   Injury with Fall? 0   0   Risk for fall due to : No Fall Risks No Fall Risks     Follow up Falls evaluation completed Education provided;Falls evaluation completed;Falls prevention discussed  Falls prevention discussed        Data saved with a previous flowsheet row definition      Assessment & Plan  Chronic kidney disease, stage 3a in setting of acquired  absence of right kidney Stage 3a CKD post-nephrectomy right  for kidney cancer. - Monitor kidney function.  Dyslipidemia  Dyslipidemia with high cholesterol levels. Patient plans to stop rosuvastatin  after current supply. Discussed medication adherence to prevent cardiovascular events. Omega-3 supplements discussed. - Check lipid panel. - EKG: normal sinus rhythm  - Discuss potential reduction of rosuvastatin  dosage from 10 mg to 5 mg based on lipid panel results.  Osteopenia Osteopenia with recent bone density check. Physical activity recommended. - Ensure bone density test is completed as scheduled.  Personal history of right breast cancer  and kidney cancer History of breast and kidney cancer. Mammogram scheduled. - Ensure mammogram is completed as scheduled.  Localized swelling/mass, left posterior shoulder (likely lipoma) Likely lipoma on left posterior shoulder. Patient requests surgical referral. - Refer to general surgeon for evaluation and potential removal of lipoma.  Skin disorder, hands (bumps, possible dyshidrosis) Bumps on hands, possibly dyshidrosis, present for extended period. Dermatologist referral discussed. - Prescribe cream for potential dryness. - Refer to dermatologist for further evaluation.

## 2023-12-06 ENCOUNTER — Ambulatory Visit
Admission: RE | Admit: 2023-12-06 | Discharge: 2023-12-06 | Disposition: A | Discharge status: Home / Self Care | Source: Ambulatory Visit | Attending: Family Medicine | Admitting: Family Medicine

## 2023-12-06 ENCOUNTER — Ambulatory Visit: Payer: Self-pay | Admitting: Family Medicine

## 2023-12-06 ENCOUNTER — Other Ambulatory Visit: Payer: Self-pay | Admitting: Family Medicine

## 2023-12-06 DIAGNOSIS — R748 Abnormal levels of other serum enzymes: Secondary | ICD-10-CM

## 2023-12-06 DIAGNOSIS — Z1231 Encounter for screening mammogram for malignant neoplasm of breast: Secondary | ICD-10-CM | POA: Diagnosis present

## 2023-12-13 ENCOUNTER — Ambulatory Visit: Payer: Self-pay | Admitting: Family Medicine

## 2023-12-13 LAB — HEPATIC FUNCTION PANEL
AG Ratio: 1.3 (calc) (ref 1.0–2.5)
ALT: 58 U/L — ABNORMAL HIGH (ref 6–29)
AST: 60 U/L — ABNORMAL HIGH (ref 10–35)
Albumin: 4.1 g/dL (ref 3.6–5.1)
Alkaline phosphatase (APISO): 146 U/L (ref 37–153)
Bilirubin, Direct: 0.1 mg/dL (ref 0.0–0.2)
Globulin: 3.2 g/dL (ref 1.9–3.7)
Indirect Bilirubin: 0.3 mg/dL (ref 0.2–1.2)
Total Bilirubin: 0.4 mg/dL (ref 0.2–1.2)
Total Protein: 7.3 g/dL (ref 6.1–8.1)

## 2023-12-13 LAB — HEPATITIS PANEL, ACUTE
Hep A IgM: NONREACTIVE
Hep B C IgM: NONREACTIVE
Hepatitis B Surface Ag: NONREACTIVE
Hepatitis C Ab: NONREACTIVE

## 2023-12-20 ENCOUNTER — Ambulatory Visit: Admitting: General Surgery

## 2023-12-20 ENCOUNTER — Encounter: Payer: Self-pay | Admitting: General Surgery

## 2023-12-20 VITALS — BP 129/81 | HR 82 | Ht 67.0 in | Wt 173.0 lb

## 2023-12-20 DIAGNOSIS — D171 Benign lipomatous neoplasm of skin and subcutaneous tissue of trunk: Secondary | ICD-10-CM

## 2023-12-20 DIAGNOSIS — R2231 Localized swelling, mass and lump, right upper limb: Secondary | ICD-10-CM

## 2023-12-20 NOTE — Progress Notes (Signed)
 Patient ID: Kelly Murray, female   DOB: [birth year], 63 y.o.   MRN: [id number] CC: Right Back Lipoma History of Present Illness Kelly Murray is a 63 y.o. female with past medical history as below presents consultation for right back lipoma.  The patient reports that she has had this for a number of years but once she has started to lose weight over the last year she has noticed the lump on her back.  She denies any overlying skin changes.  She denies any drainage from the area.  She says that she had a similar lipoma removed from her right arm.  She denies any unexplained weight loss, fevers or chills.  She denies taking any blood thinners..  Past Medical History Past Medical History:  Diagnosis Date   Breast cancer (HCC) 2010   Right lumpectomy   Cancer of kidney (HCC) 2015   Right   History of colon polyps    Personal history of radiation therapy 2010   BREAST CA       Past Surgical History:  Procedure Laterality Date   BREAST BIOPSY Right 2010   + radiation   BREAST BIOPSY Right 2015   2 areas- stereo, neg   BREAST LUMPECTOMY Right 2010   COLONOSCOPY W/ POLYPECTOMY  08/13/2005   COLONOSCOPY WITH PROPOFOL  N/A 03/24/2015   Procedure: COLONOSCOPY WITH PROPOFOL ;  Surgeon: Gladis RAYMOND Mariner, MD;  Location: [facility] ENDOSCOPY;  Service: Endoscopy;  Laterality: N/A;   NEPHRECTOMY Right 04/2013   TOTAL ABDOMINAL HYSTERECTOMY  04/2013    Allergies  Allergen Reactions   Iodine    Shellfish Allergy Nausea And Vomiting    No current outpatient medications on file.   No current facility-administered medications for this visit.    Family History Family History  Problem Relation Age of Onset   Cancer Father    Alcoholism Father    Breast cancer Paternal Aunt 108   Breast cancer Paternal Aunt 78       Social History Social History   Tobacco Use   Smoking status: Never   Smokeless tobacco: Never  Substance Use Topics   Alcohol use: Yes    Comment: socially   Drug use: No         ROS Full ROS of systems performed and is otherwise negative there than what is stated in the HPI  Physical Exam Blood pressure 129/81, pulse 82, height 5' 7 (1.702 m), weight 173 lb (78.5 kg), SpO2 98%.  Alert and oriented x 3, normal work of breathing room air, regular rate and rhythm, right shoulder there is a soft tissue mass that is mobile measuring approximately 4 cm without any overlying skin changes or pain to palpation. Data Reviewed Hemoglobin A1c reviewed and not prohibitive for surgical intervention  I have personally reviewed the patient's imaging and medical records.    Assessment    Patient with right posterior shoulder lipoma that she wants removed.  Plan    I discussed the risk, benefits alternatives of the procedure including risk infection, bleeding, recurrence, wound complications.  She understands these risk and wishes to proceed with the resection.  We can do this in office.  I recommended that she take ibuprofen 30 minutes prior to coming to her appointment.    Jayson MALVA Endow 12/20/2023, 10:13 AM

## 2023-12-22 ENCOUNTER — Other Ambulatory Visit: Payer: Self-pay

## 2023-12-22 ENCOUNTER — Ambulatory Visit

## 2023-12-22 DIAGNOSIS — L821 Other seborrheic keratosis: Secondary | ICD-10-CM

## 2023-12-22 DIAGNOSIS — L820 Inflamed seborrheic keratosis: Secondary | ICD-10-CM

## 2023-12-22 DIAGNOSIS — L818 Other specified disorders of pigmentation: Secondary | ICD-10-CM

## 2023-12-22 DIAGNOSIS — L301 Dyshidrosis [pompholyx]: Secondary | ICD-10-CM | POA: Diagnosis not present

## 2023-12-22 DIAGNOSIS — L816 Other disorders of diminished melanin formation: Secondary | ICD-10-CM

## 2023-12-22 MED ORDER — CLOBETASOL PROPIONATE 0.05 % EX OINT
TOPICAL_OINTMENT | CUTANEOUS | 0 refills | Status: AC
  Filled 2023-12-22: qty 60, 30d supply, fill #0

## 2023-12-22 NOTE — Patient Instructions (Signed)

## 2023-12-22 NOTE — Progress Notes (Signed)
 Subjective   Kelly Murray is a 63 y.o. female who presents for the following: Lesion(s) of concern . Patient is new patient  Today patient reports: Pt c/o small hard bumps on the hands and R ankle x 1 year, she tried OTC wart remover but that hasn't helped.   Review of Systems:    No other skin or systemic complaints except as noted in HPI or Assessment and Plan.  The following portions of the chart were reviewed this encounter and updated as appropriate: medications, allergies, medical history  Relevant Medical History:  Kidney and breast cancer  Objective  (SKPE) Well appearing patient in no apparent distress; mood and affect are within normal limits. Examination was performed of the: Focused Exam of: the hands, arms, and legs   Examination notable for - Hands with xerosis and lichenified plaques, fissuring of webspaces and scale - Multiple stuck-on brown, tan and grey papillated papules and plaques on LEGS - Confetti-like macules of hypopigmentation scattered over the sun-exposed areas of the forearms, anterior legs and shoulders Examination limited by: n/a   R lower leg x 1, R hand x 1 (2) Erythematous stuck-on, waxy papule or plaque  Assessment & Plan  (SKAP)   Dyshidrotic eczema - mild Chronic and persistent condition with duration or expected duration over one year. Condition is symptomatic and bothersome to patient. Patient is flaring and not currently at treatment goal.  -Discussed the diagnosis and reassured the pt regarding the benign nature of this condition -Start clobetasol 0.05% ointment bid with overnight glove occlusion -Encouraged pt to use moisturizer following each hand washing -Encouraged pt to decrease frequency of hand washing to < 5 times daily -General skin care was discussed including: at least twice daily use of a greasy emollient (recommended cerave cream, vanicream, vaseline, aquaphor ointment), taking warm showers once daily lasting 10 minutes or  less, using a mild soap (recommended Dove), immediate application of emollient after exiting bath/shower and avoiding skin care products that contain fragrances   SEBORRHEIC KERATOSIS - Stuck-on, waxy, tan-brown papules and/or plaques  - Benign-appearing - Discussed benign etiology and prognosis. 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  - Reassured the patient that this is not vitiligo - Stressed the importance of daily sunscreen use, SPF 50 or above daily.  Level of service outlined above   Patient instructions (SKPI)   Procedures, orders, diagnosis for this visit:  INFLAMED SEBORRHEIC KERATOSIS (2) R lower leg x 1, R hand x 1 (2) Symptomatic, irritating, patient would like treated.  Destruction of lesion - R lower leg x 1, R hand x 1 (2) Complexity: simple   Destruction method: cryotherapy   Informed consent: discussed and consent obtained   Timeout:  patient name, date of birth, surgical site, and procedure verified Lesion destroyed using liquid nitrogen: Yes   Region frozen until ice ball extended beyond lesion: Yes   Cryo cycles: 1 or 2. Outcome: patient tolerated procedure well with no complications   Post-procedure details: wound care instructions given     Inflamed seborrheic keratosis -     Destruction of lesion  Other orders -     Clobetasol Propionate; Apply 1 gram topically to affected area of hands twice daily. Stop once resolved and restart as needed for flares. Avoid use on face, armpits, groin  unless otherwise indicated.  Dispense: 60 g; Refill: 0    Return to clinic: Return if symptoms worsen or fail to improve.  LILLETTE Rosina Mayans, CMA, am acting as scribe for Lauraine JAYSON Kanaris, MD .   Documentation: I have reviewed the above documentation for accuracy and completeness, and I agree with the  above.  Lauraine JAYSON Kanaris, MD

## 2023-12-27 ENCOUNTER — Ambulatory Visit
Admission: RE | Admit: 2023-12-27 | Discharge: 2023-12-27 | Disposition: A | Discharge status: Home / Self Care | Source: Ambulatory Visit | Attending: Family Medicine | Admitting: Family Medicine

## 2023-12-27 DIAGNOSIS — Z780 Asymptomatic menopausal state: Secondary | ICD-10-CM | POA: Insufficient documentation

## 2023-12-27 DIAGNOSIS — M8580 Other specified disorders of bone density and structure, unspecified site: Secondary | ICD-10-CM | POA: Insufficient documentation

## 2024-01-05 ENCOUNTER — Encounter: Payer: Self-pay | Admitting: General Surgery

## 2024-01-05 ENCOUNTER — Ambulatory Visit: Admitting: General Surgery

## 2024-01-05 VITALS — BP 126/82 | HR 86 | Temp 97.9°F | Ht 67.0 in | Wt 169.0 lb

## 2024-01-05 DIAGNOSIS — R2231 Localized swelling, mass and lump, right upper limb: Secondary | ICD-10-CM

## 2024-01-05 DIAGNOSIS — D1721 Benign lipomatous neoplasm of skin and subcutaneous tissue of right arm: Secondary | ICD-10-CM

## 2024-01-05 NOTE — Progress Notes (Signed)
 Procedure note    Procedure: Excision of soft tissue mass measuring 4.5cm down to fascial layer  After informed consent was obtained the patient was placed prone on our procedure room table.  Her right posterior shoulder was then prepped and draped in the usual standard fashion.  A surgical timeout was called identifying correct patient, site, side and procedure.  Patient comfort was obtained by injection of 10 cc of 1% lidocaine with epinephrine.  An incision was made over the soft tissue mass.  It was taken down through the subcutaneous tissue.  The mass was dissected circumferentially from the underlying subcutaneous tissue.  The mass did extend into the deltoid fascia.  It was dissected off the deltoid fascia and it was able to be eviscerated from the wound.  There were multilobular wall of the mass and it was dissected fully.  Once it was able to be fully removed from the tissue was passed off the table specimen.  The cavity was then irrigated with 2 cc of the local anesthetic.  Hemostasis was obtained with pressure.  The mass was approximately 4-1/2 cm and extended down to the fascial layer.  The subcutaneous tissue was then closed with 3-0 Vicryl and the skin was closed with 4-0 Monocryl and dressed with glue.

## 2024-01-05 NOTE — Patient Instructions (Signed)
 Today we have removed a Lipoma in our office. Please see information below regarding this type of tumor.  You are free to shower 01/06/2024. Do not scrub at the area.   You have glue on your skin and sutures under the skin. The glue will come off on it's own in 10-14 days. You may shower normally until this occurs but do not submerge.  Please use Tylenol or Ibuprofen for pain as needed. You may use ice to the area 3-4 times today and tomorrow for any achiness.   We will see you back in 7-10 days to ensure that this has healed and to review the final pathology. Please see your appointment below. You may continue your regular activities right away but if you are having pain while doing something, stop what you are doing and try this activity once again in 3 days. Please call our office with any questions or concerns prior to your appointment.  Call to report any excessive bleeding, spreading redness, or increased pain.    Lipoma Removal Lipoma removal is a surgical procedure to remove a noncancerous (benign) tumor that is made up of fat cells (lipoma). Most lipomas are small and painless and do not require treatment. They can form in many areas of the body but are most common under the skin of the back, shoulders, arms, and thighs. You may need lipoma removal if you have a lipoma that is large, growing, or causing discomfort. Lipoma removal may also be done for cosmetic reasons. Tell a health care provider about: Any allergies you have. All medicines you are taking, including vitamins, herbs, eye drops, creams, and over-the-counter medicines. Any problems you or family members have had with anesthetic medicines. Any blood disorders you have. Any surgeries you have had. Any medical conditions you have. Whether you are pregnant or may be pregnant. What are the risks? Generally, this is a safe procedure. However, problems may occur, including: Infection. Bleeding. Allergic reactions to  medicines. Damage to nerves or blood vessels near the lipoma. Scarring.  Medicines Ask your health care provider about: Changing or stopping your regular medicines. This is especially important if you are taking diabetes medicines or blood thinners. Taking medicines such as aspirin and ibuprofen. These medicines can thin your blood. Do not take these medicines before your procedure if your health care provider instructs you not to. You may be given antibiotic medicine to help prevent infection. General instructions Ask your health care provider how your surgical site will be marked or identified. You will have a physical exam. Your health care provider will check the size of the lipoma and whether it can be moved easily.  What happens during the procedure? To reduce your risk of infection: Your health care team will wash or sanitize their hands. Your skin will be washed with surgical soap. You will be given the following: A medicine to numb the area (local anesthetic). An incision will be made over the lipoma or very near the lipoma. The incision may be made in a natural skin line or crease. Tissues, nerves, and blood vessels near the lipoma will be moved out of the way. The lipoma and the capsule that surrounds it will be separated from the surrounding tissues. The lipoma will be removed. The incision may be closed with stitches and surgical glue

## 2024-01-09 LAB — SURGICAL PATHOLOGY

## 2024-01-19 ENCOUNTER — Ambulatory Visit: Admitting: General Surgery

## 2024-06-05 ENCOUNTER — Encounter: Admitting: Family Medicine
